# Patient Record
Sex: Male | Born: 1955
Health system: Southern US, Community
[De-identification: ages and names within clinical notes are randomized; demographics above are authoritative.]

## PROBLEM LIST (undated history)

## (undated) DIAGNOSIS — R739 Hyperglycemia, unspecified: Secondary | ICD-10-CM

## (undated) DIAGNOSIS — B1710 Acute hepatitis C without hepatic coma: Secondary | ICD-10-CM

## (undated) DIAGNOSIS — R5382 Chronic fatigue, unspecified: Secondary | ICD-10-CM

## (undated) DIAGNOSIS — K7460 Unspecified cirrhosis of liver: Secondary | ICD-10-CM

## (undated) DIAGNOSIS — R040 Epistaxis: Secondary | ICD-10-CM

## (undated) HISTORY — DX: Unspecified cirrhosis of liver: K74.60

## (undated) HISTORY — PX: HERNIA REPAIR: SHX51

## (undated) HISTORY — DX: Epistaxis: R04.0

## (undated) HISTORY — DX: Hyperglycemia, unspecified: R73.9

## (undated) HISTORY — DX: Acute hepatitis C without hepatic coma: B17.10

## (undated) HISTORY — DX: Chronic fatigue, unspecified: R53.82

---

## 2008-08-26 ENCOUNTER — Ambulatory Visit: Payer: Self-pay | Admitting: Family Medicine

## 2010-12-26 DIAGNOSIS — B182 Chronic viral hepatitis C: Secondary | ICD-10-CM | POA: Insufficient documentation

## 2011-10-18 ENCOUNTER — Ambulatory Visit: Payer: Self-pay | Admitting: Surgery

## 2011-10-24 ENCOUNTER — Ambulatory Visit: Payer: Self-pay | Admitting: Surgery

## 2011-11-01 ENCOUNTER — Ambulatory Visit: Payer: Self-pay | Admitting: Oncology

## 2011-11-24 ENCOUNTER — Ambulatory Visit: Payer: Self-pay | Admitting: Gastroenterology

## 2011-11-26 ENCOUNTER — Ambulatory Visit: Payer: Self-pay | Admitting: Oncology

## 2011-11-29 ENCOUNTER — Ambulatory Visit: Payer: Self-pay | Admitting: Unknown Physician Specialty

## 2011-11-29 ENCOUNTER — Ambulatory Visit: Payer: Self-pay | Admitting: Gastroenterology

## 2011-12-01 ENCOUNTER — Ambulatory Visit: Payer: Self-pay | Admitting: Surgery

## 2012-03-18 IMAGING — CT CT ABDOMEN W/ CM
1 of 2 series · 15 of 32 positions shown, 20 images · non-contrast
Comparison: none

REASON FOR EXAM: hepatitis
COMMENTS:

PROCEDURE:     CT  - CT ABDOMEN STANDARD W  - November 29, 2011  [DATE]
RESULT:
HISTORY: Hepatitis.
Comparison Study: No recent.

[Series 2: soft tissue · axial · 0.78mm/px · z∈[-828,-573]mm · 15 of 96 slices shown, 20 images]
[im 6/96  soft-tissue]
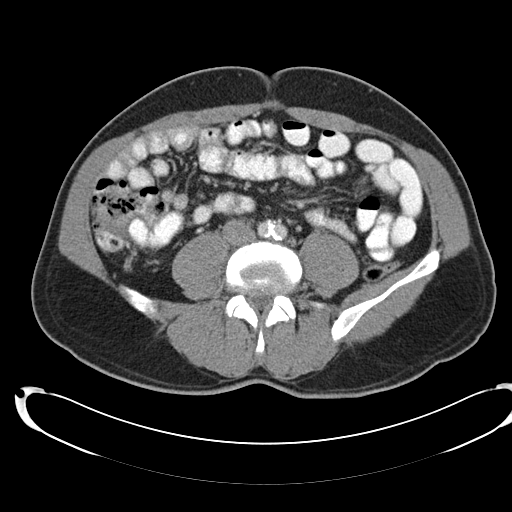
[im 6/96  bone]
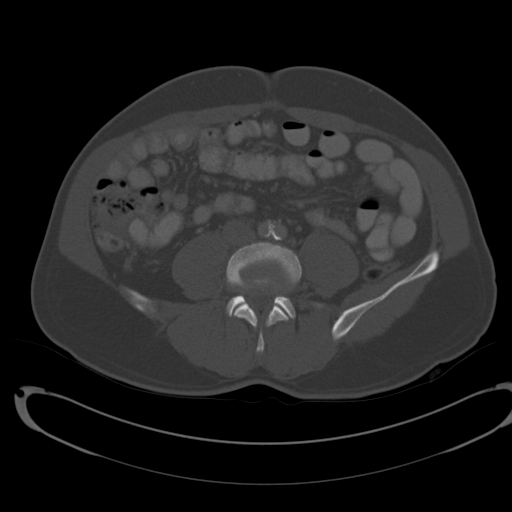
[im 11/96  soft-tissue]
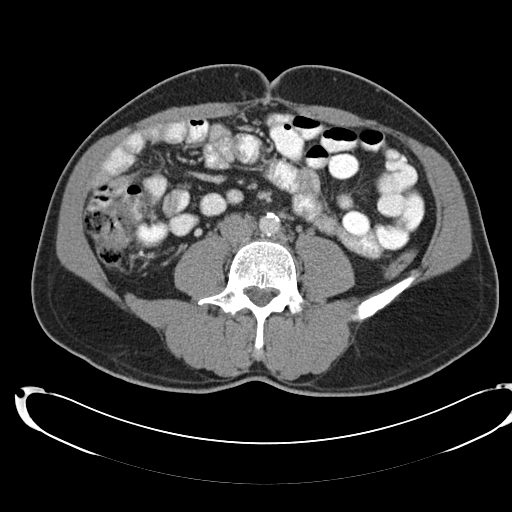
[im 21/96  soft-tissue]
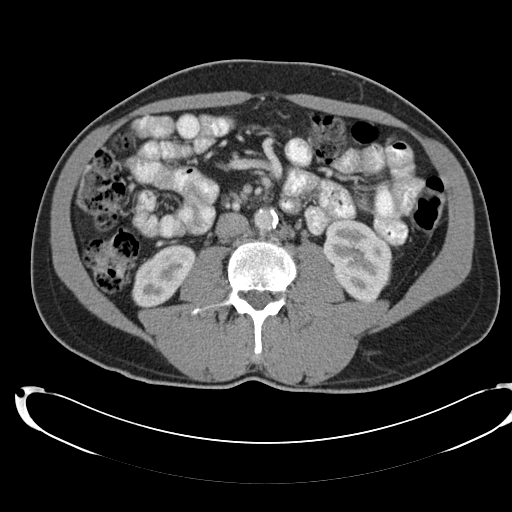
[im 26/96  soft-tissue]
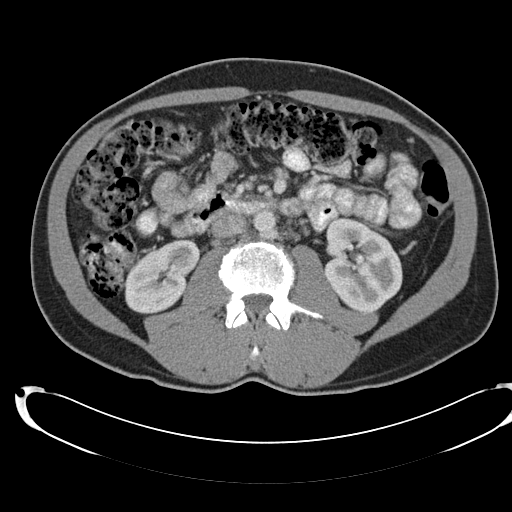
[im 31/96  soft-tissue]
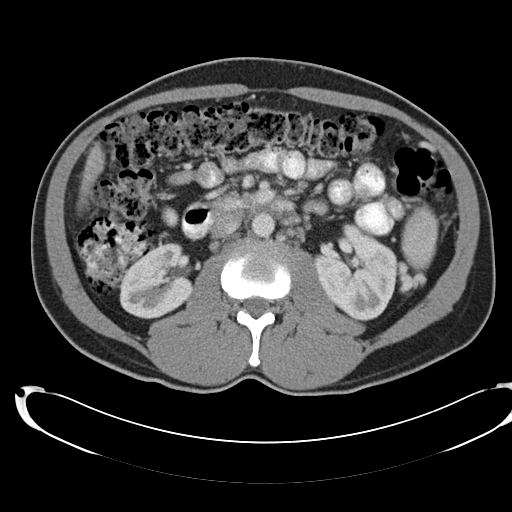
[im 41/96  soft-tissue]
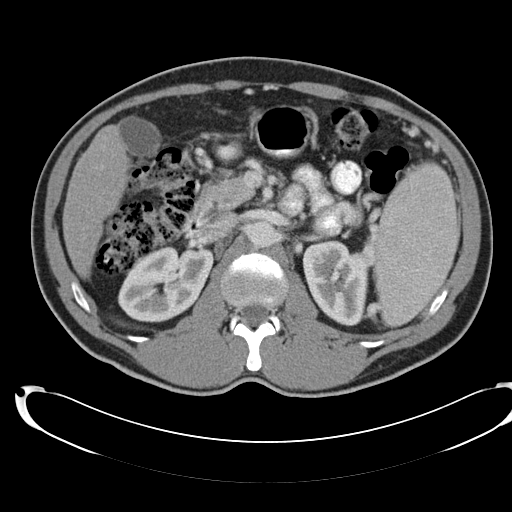
[im 46/96  soft-tissue]
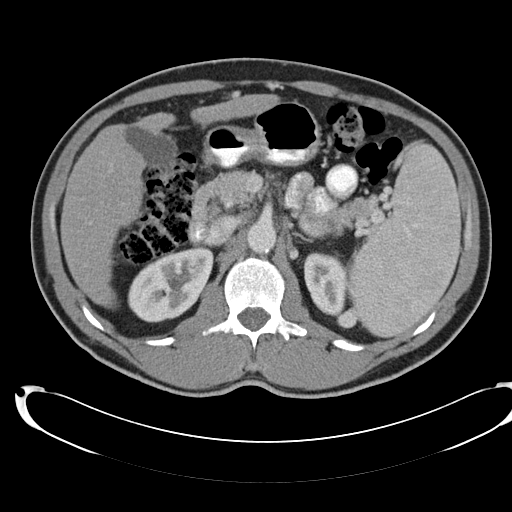
[im 51/96  soft-tissue]
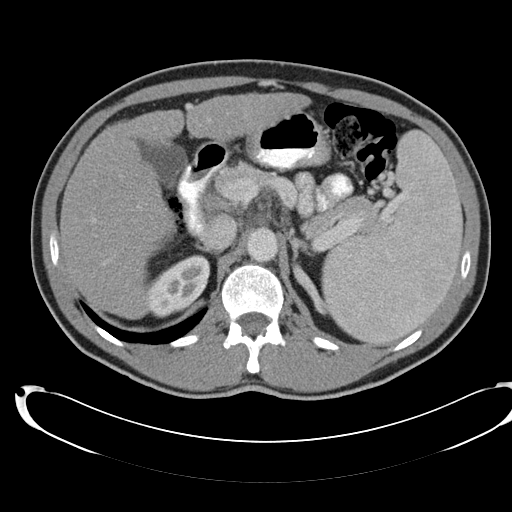
[im 56/96  soft-tissue]
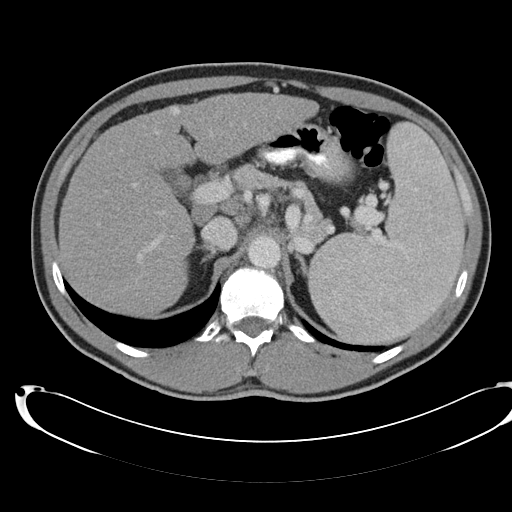
[im 56/96  bone]
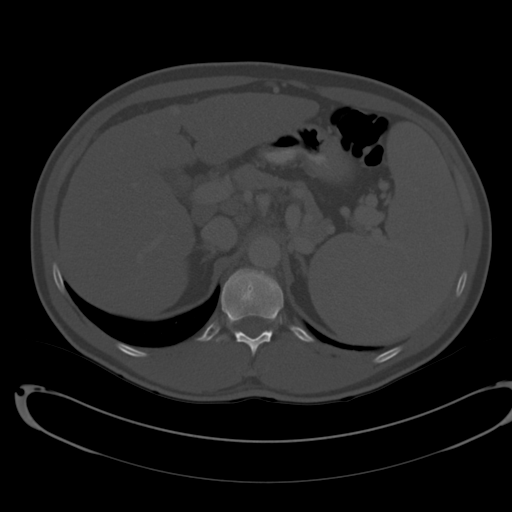
[im 66/96  soft-tissue]
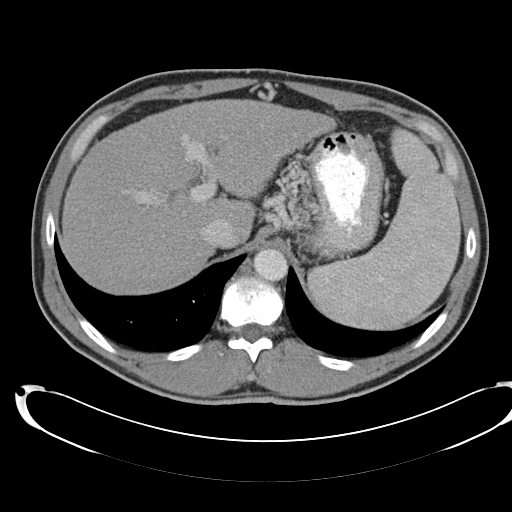
[im 71/96  soft-tissue]
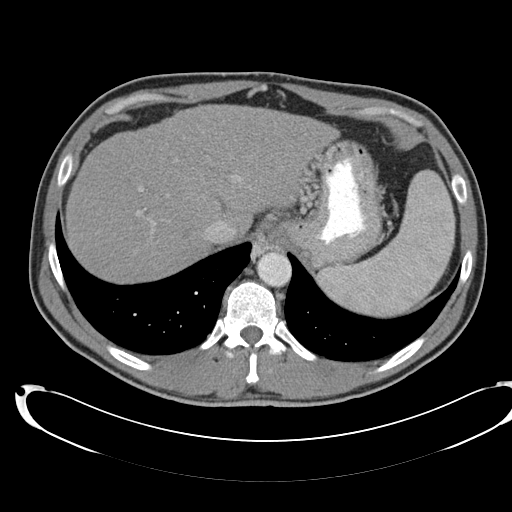
[im 76/96  soft-tissue]
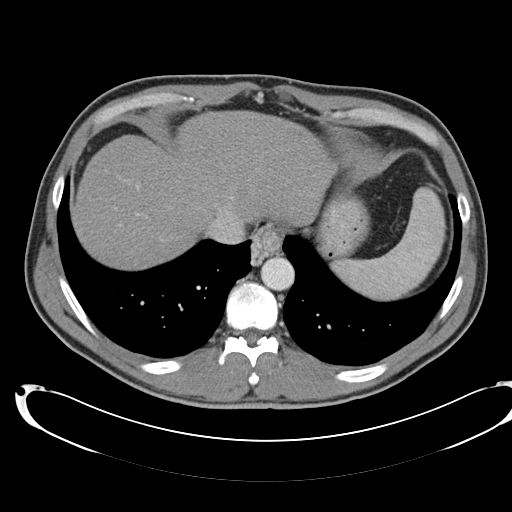
[im 76/96  lung]
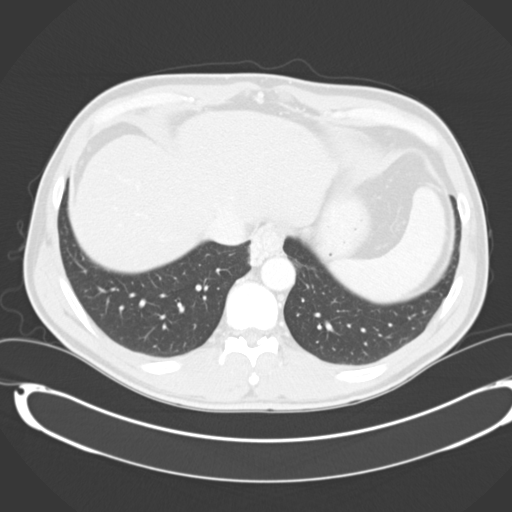
[im 81/96  lung]
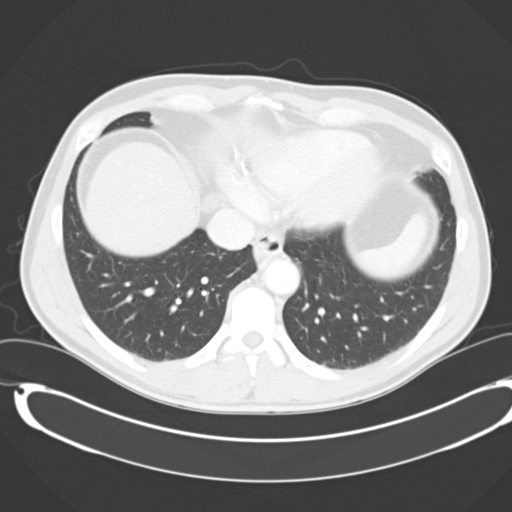
[im 86/96  soft-tissue]
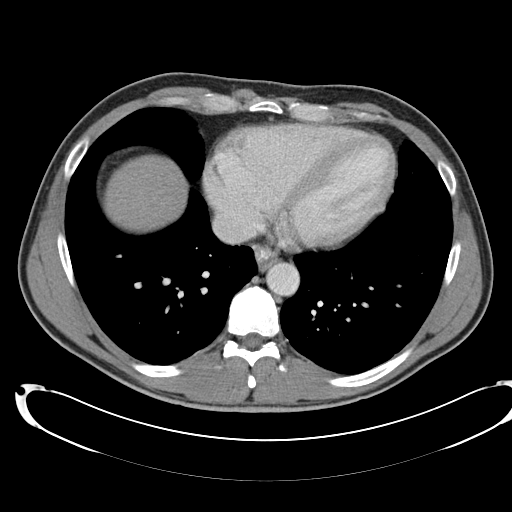
[im 86/96  lung]
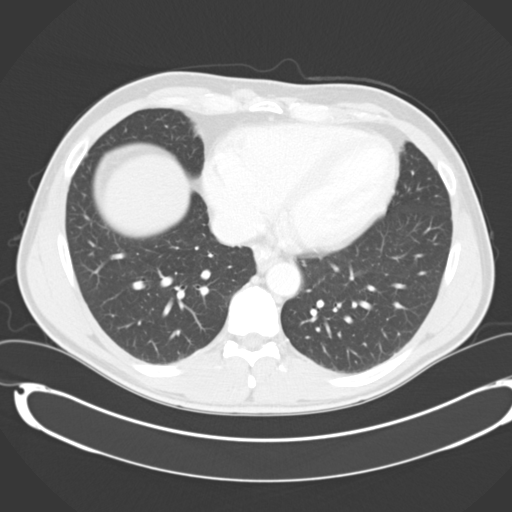
[im 91/96  soft-tissue]
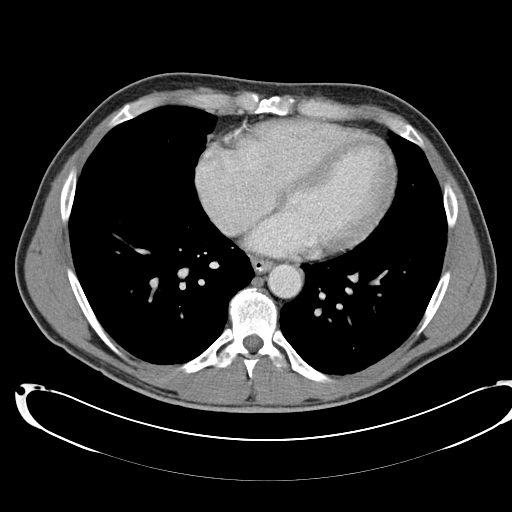
[im 91/96  lung]
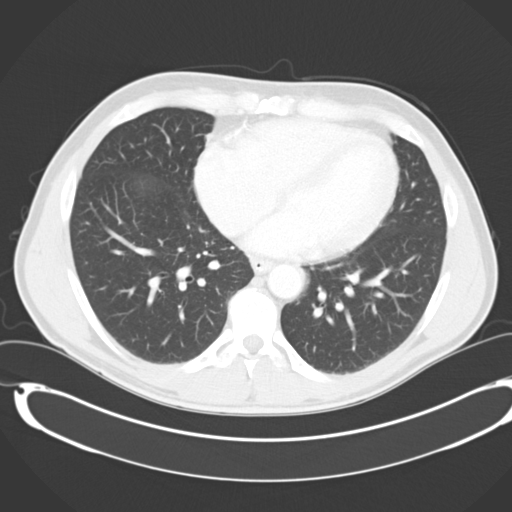

[15 of 32 positions shown; findings below may reference images not displayed]

FINDINGS: Standard CT obtained with 100 ml of [id number]-[id number]. The liver is
normal. Gallbladder is nondistended. The spleen is enlarged. The pancreas is
normal. Prominent varices are noted in the perigastric and perisplenic
region. These findings are consistent with portal hypertension. The portal
vein is patent. The adrenals are normal. The kidneys are normal.
Retroperitoneal nodularity is noted. These may represent retroperitoneal
varices. Small retroperitoneal lymph nodes cannot be excluded. Aorta is
nondistended. Lung bases are clear. No free air. No evidence of bowel
obstruction.
IMPRESSION: Findings consistent with portal hypertension and
splenomegaly. Prominent varices are noted.

## 2015-02-09 LAB — HM COLONOSCOPY

## 2015-08-27 ENCOUNTER — Encounter: Payer: Self-pay | Admitting: Family Medicine

## 2015-08-28 ENCOUNTER — Ambulatory Visit (INDEPENDENT_AMBULATORY_CARE_PROVIDER_SITE_OTHER): Payer: BLUE CROSS/BLUE SHIELD | Admitting: Family Medicine

## 2015-08-28 ENCOUNTER — Encounter: Payer: Self-pay | Admitting: Family Medicine

## 2015-08-28 ENCOUNTER — Telehealth: Payer: Self-pay | Admitting: Family Medicine

## 2015-08-28 VITALS — BP 120/60 | HR 56 | Temp 98.4°F | Resp 16 | Ht 70.0 in | Wt 194.0 lb

## 2015-08-28 DIAGNOSIS — R7309 Other abnormal glucose: Secondary | ICD-10-CM | POA: Diagnosis not present

## 2015-08-28 DIAGNOSIS — K766 Portal hypertension: Secondary | ICD-10-CM

## 2015-08-28 DIAGNOSIS — B182 Chronic viral hepatitis C: Secondary | ICD-10-CM | POA: Insufficient documentation

## 2015-08-28 DIAGNOSIS — I8510 Secondary esophageal varices without bleeding: Secondary | ICD-10-CM

## 2015-08-28 DIAGNOSIS — K7460 Unspecified cirrhosis of liver: Secondary | ICD-10-CM

## 2015-08-28 DIAGNOSIS — R7303 Prediabetes: Secondary | ICD-10-CM | POA: Insufficient documentation

## 2015-08-28 DIAGNOSIS — B1710 Acute hepatitis C without hepatic coma: Secondary | ICD-10-CM | POA: Insufficient documentation

## 2015-08-28 LAB — POCT GLYCOSYLATED HEMOGLOBIN (HGB A1C)

## 2015-08-28 NOTE — Progress Notes (Signed)
Name: Jesus Mejia   MRN: [id number]    DOB: 12/27/1955   Date:08/28/2015       Progress Note  Subjective  Chief Complaint  Chief Complaint  Patient presents with  . Diabetes    HPI Here for f/u of Portal hypertenskion due to chronic liver disease and pre-dioaetes.  Has changed diet and lifestyle.  Feels well.  Sees [facility] re: his liver disease.  No problem-specific assessment & plan notes found for this encounter.   Past Medical History  Diagnosis Date  . Acute hepatitis C   . Cirrhosis, non-alcoholic   . Bleeding nose   . Chronic fatigue   . Elevated serum glucose     Social History  Substance Use Topics  . Smoking status: Former Smoker -- 1.00 packs/day for 20 years    Types: Cigarettes    Quit date: 09/08/1993  . Smokeless tobacco: Never Used  . Alcohol Use: No     Comment: former quit 14 years ago     Current outpatient prescriptions:  .  calcium & magnesium carbonates (MYLANTA) 311-232 MG per tablet, Take 1 tablet by mouth daily., Disp: , Rfl:  .  Methylsulfonylmethane 1000 MG TABS, Take 1 tablet by mouth daily., Disp: , Rfl:  .  Multiple Vitamin (MULTIVITAMIN WITH MINERALS) TABS tablet, Take 1 tablet by mouth daily., Disp: , Rfl:  .  nadolol (CORGARD) 20 MG tablet, Take 20 mg by mouth daily., Disp: , Rfl:   No Known Allergies  Review of Systems  Constitutional: Positive for weight loss (desired). Negative for fever, chills and malaise/fatigue.  HENT: Negative for hearing loss.   Eyes: Negative for blurred vision and double vision.  Respiratory: Negative for sputum production, shortness of breath and wheezing.   Cardiovascular: Negative for chest pain, palpitations, orthopnea, claudication and leg swelling.  Gastrointestinal: Negative for heartburn, nausea, vomiting, abdominal pain, diarrhea and blood in stool.  Genitourinary: Negative for dysuria, urgency and frequency.  Musculoskeletal: Negative for myalgias and joint pain.  Skin: Negative for rash.   Neurological: Negative for dizziness, weakness and headaches.      Objective  Filed Vitals:   08/28/15 1342 08/28/15 1418  BP: 128/74 120/60  Pulse: 52 56  Temp: 98.4 F (36.9 C)   TempSrc: Oral   Resp: 16   Height:  (1.778 m)   Weight: 194 lb (87.998 kg)      Physical Exam  Constitutional: He is oriented to person, place, and time and well-developed, well-nourished, and in no distress. No distress.  HENT:  Head: Normocephalic and atraumatic.  Neck: Normal range of motion. Neck supple. Carotid bruit is not present. No thyromegaly present.  Cardiovascular: Normal rate, regular rhythm and intact distal pulses.  Exam reveals no gallop and no friction rub.   Murmur heard.  Systolic murmur is present with a grade of 1/6  URSB  Pulmonary/Chest: Effort normal and breath sounds normal. No respiratory distress. He has no wheezes. He has no rales.  Abdominal: Soft. Bowel sounds are normal. He exhibits no distension, no abdominal bruit and no mass. There is no tenderness.  Musculoskeletal: He exhibits no edema.  Lymphadenopathy:    He has no cervical adenopathy.  Neurological: He is alert and oriented to person, place, and time.  Vitals reviewed.     Recent Results (from the past 2160 hour(s))  POCT HgB A1C     Status: Normal   Collection Time: 08/28/15  2:05 PM  Result Value Ref Range   Hemoglobin  A1C 5.5 %      Assessment & Plan  1. Prediabetes  - POCT HgB A1C--5.5. 2. Hypertension, portal   3. Esophageal varices in cirrhosis

## 2015-08-28 NOTE — Patient Instructions (Signed)
Continue to follow good diet and exercise pattern.   Cont. To follow [facility]. To get Flu shot at wife"s work free next month.

## 2015-10-07 NOTE — Telephone Encounter (Signed)
Error

## 2015-10-26 ENCOUNTER — Ambulatory Visit (INDEPENDENT_AMBULATORY_CARE_PROVIDER_SITE_OTHER): Payer: BLUE CROSS/BLUE SHIELD

## 2015-10-26 DIAGNOSIS — Z23 Encounter for immunization: Secondary | ICD-10-CM | POA: Diagnosis not present

## 2015-12-11 ENCOUNTER — Encounter: Payer: Self-pay | Admitting: Family Medicine

## 2015-12-11 ENCOUNTER — Ambulatory Visit (INDEPENDENT_AMBULATORY_CARE_PROVIDER_SITE_OTHER): Payer: BLUE CROSS/BLUE SHIELD | Admitting: Family Medicine

## 2015-12-11 VITALS — BP 162/95 | HR 60 | Temp 98.0°F | Resp 16 | Ht 70.0 in | Wt 194.0 lb

## 2015-12-11 DIAGNOSIS — R7309 Other abnormal glucose: Secondary | ICD-10-CM | POA: Diagnosis not present

## 2015-12-11 DIAGNOSIS — I8510 Secondary esophageal varices without bleeding: Secondary | ICD-10-CM

## 2015-12-11 DIAGNOSIS — K766 Portal hypertension: Secondary | ICD-10-CM

## 2015-12-11 DIAGNOSIS — R7303 Prediabetes: Secondary | ICD-10-CM

## 2015-12-11 DIAGNOSIS — K7460 Unspecified cirrhosis of liver: Secondary | ICD-10-CM

## 2015-12-11 LAB — POCT GLYCOSYLATED HEMOGLOBIN (HGB A1C)

## 2015-12-11 MED ORDER — NADOLOL 20 MG PO TABS: ORAL_TABLET | ORAL | Status: DC

## 2015-12-11 MED ORDER — LOSARTAN POTASSIUM 25 MG PO TABS: 25.0000 mg | ORAL_TABLET | Freq: Every day | ORAL | Status: DC

## 2015-12-11 NOTE — Progress Notes (Signed)
Name: Jesus Mejia   MRN: [id number]    DOB: 1956/01/05   Date:12/11/2015       Progress Note  Subjective  Chief Complaint  Chief Complaint  Patient presents with  . Hyperglycemia    HPI Here for f/u of pre-diabetes.  Has chronic liver disease and some esophageal varices.  HE is on Beta blocker to reduce pressure in Liver.  No problem-specific assessment & plan notes found for this encounter.   Past Medical History  Diagnosis Date  . Acute hepatitis C   . Cirrhosis, non-alcoholic (HCC)   . Bleeding nose   . Chronic fatigue   . Elevated serum glucose     Past Surgical History  Procedure Laterality Date  . Hernia repair      Family History  Problem Relation Age of Onset  . Cancer Mother     breast  . Cancer Sister     breast  . Hypertension Brother     Social History   Social History  . Marital Status: Married    Spouse Name: N/A  . Number of Children: N/A  . Years of Education: N/A   Occupational History  . Not on file.   Social History Main Topics  . Smoking status: Former Smoker -- 1.00 packs/day for 20 years    Types: Cigarettes    Quit date: 09/08/1993  . Smokeless tobacco: Never Used  . Alcohol Use: No     Comment: former quit 14 years ago  . Drug Use: No     Comment: former quit 15 years ago  . Sexual Activity: Not on file   Other Topics Concern  . Not on file   Social History Narrative     Current outpatient prescriptions:  .  calcium & magnesium carbonates (MYLANTA) 311-232 MG per tablet, Take 1 tablet by mouth daily as needed. , Disp: , Rfl:  .  Methylsulfonylmethane 1000 MG TABS, Take 1 tablet by mouth daily., Disp: , Rfl:  .  Multiple Vitamin (MULTIVITAMIN WITH MINERALS) TABS tablet, Take 1 tablet by mouth daily., Disp: , Rfl:  .  nadolol (CORGARD) 20 MG tablet, Takes 1/2 tablet daily, Disp: 30 tablet, Rfl: 12 .  losartan (COZAAR) 25 MG tablet, Take 1 tablet (25 mg total) by mouth daily., Disp: 30 tablet, Rfl: 12  Not on  File   Review of Systems  Constitutional: Negative for fever, chills, weight loss and malaise/fatigue.  HENT: Negative for hearing loss.   Eyes: Negative for blurred vision, double vision and redness.  Respiratory: Negative for cough, shortness of breath and wheezing.   Cardiovascular: Negative for chest pain, palpitations and leg swelling.  Gastrointestinal: Negative for heartburn, nausea, vomiting, abdominal pain and blood in stool.  Genitourinary: Negative for dysuria, urgency and frequency.  Skin: Negative for rash.  Neurological: Negative for dizziness, tremors, weakness and headaches.  Psychiatric/Behavioral: Negative for depression.      Objective  Filed Vitals:   12/11/15 1334  BP: 162/95  Pulse: 60  Temp: 98 F (36.7 C)  TempSrc: Oral  Resp: 16  Height: 5\' 10"  (1.778 m)  Weight: 194 lb (87.998 kg)    Physical Exam  Constitutional: He is oriented to person, place, and time and well-developed, well-nourished, and in no distress. No distress.  HENT:  Head: Normocephalic and atraumatic.  Eyes: Conjunctivae are normal. Pupils are equal, round, and reactive to light. No scleral icterus.  Neck: Neck supple. Carotid bruit is not present. No thyromegaly present.  Cardiovascular: Normal  rate, regular rhythm and normal heart sounds.  Exam reveals no gallop and no friction rub.   No murmur heard. Pulmonary/Chest: Effort normal and breath sounds normal. No respiratory distress. He has no wheezes. He has no rales.  Abdominal: Soft. Bowel sounds are normal. He exhibits no distension, no abdominal bruit and no mass. There is no tenderness.  Musculoskeletal: He exhibits edema.  Lymphadenopathy:    He has no cervical adenopathy.  Neurological: He is alert and oriented to person, place, and time.  Vitals reviewed.      No results found for this or any previous visit (from the past 2160 hour(s)).   Assessment & Plan  Problem List Items Addressed This Visit       Cardiovascular and Mediastinum   Esophageal varices in cirrhosis (HCC)   Relevant Medications   losartan (COZAAR) 25 MG tablet   nadolol (CORGARD) 20 MG tablet   Hypertension, portal (HCC)   Relevant Medications   losartan (COZAAR) 25 MG tablet   nadolol (CORGARD) 20 MG tablet     Other   Prediabetes   Elevated glucose - Primary   Relevant Orders   POCT HgB A1C      Meds ordered this encounter  Medications  . losartan (COZAAR) 25 MG tablet    Sig: Take 1 tablet (25 mg total) by mouth daily.    Dispense:  30 tablet    Refill:  12  . nadolol (CORGARD) 20 MG tablet    Sig: Takes 1/2 tablet daily    Dispense:  30 tablet    Refill:  12   1. Elevated glucose  - POCT HgB A1C-5.2  2. Hypertension, portal (HCC)  - losartan (COZAAR) 25 MG tablet; Take 1 tablet (25 mg total) by mouth daily.  Dispense: 30 tablet; Refill: 12 - nadolol (CORGARD) 20 MG tablet; Takes 1/2 tablet daily  Dispense: 30 tablet; Refill: 12  3. Esophageal varices in cirrhosis (HCC)   4. Prediabetes

## 2016-02-02 ENCOUNTER — Ambulatory Visit: Payer: BLUE CROSS/BLUE SHIELD | Admitting: Family Medicine

## 2016-03-07 ENCOUNTER — Other Ambulatory Visit: Payer: Self-pay | Admitting: Family Medicine

## 2016-03-07 DIAGNOSIS — K766 Portal hypertension: Secondary | ICD-10-CM

## 2016-03-07 MED ORDER — LOSARTAN POTASSIUM 25 MG PO TABS: 25.0000 mg | ORAL_TABLET | Freq: Every day | ORAL | Status: DC

## 2016-03-10 ENCOUNTER — Other Ambulatory Visit: Payer: Self-pay

## 2016-03-10 DIAGNOSIS — K766 Portal hypertension: Secondary | ICD-10-CM

## 2016-03-10 MED ORDER — LOSARTAN POTASSIUM 25 MG PO TABS: 25.0000 mg | ORAL_TABLET | Freq: Every day | ORAL | Status: DC

## 2016-03-28 ENCOUNTER — Encounter: Payer: Self-pay | Admitting: Family Medicine

## 2016-03-28 ENCOUNTER — Ambulatory Visit (INDEPENDENT_AMBULATORY_CARE_PROVIDER_SITE_OTHER): Payer: BLUE CROSS/BLUE SHIELD | Admitting: Family Medicine

## 2016-03-28 VITALS — BP 125/70 | HR 60 | Resp 16 | Ht 70.0 in | Wt 192.8 lb

## 2016-03-28 DIAGNOSIS — K766 Portal hypertension: Secondary | ICD-10-CM | POA: Diagnosis not present

## 2016-03-28 DIAGNOSIS — B182 Chronic viral hepatitis C: Secondary | ICD-10-CM | POA: Diagnosis not present

## 2016-03-28 DIAGNOSIS — B1920 Unspecified viral hepatitis C without hepatic coma: Secondary | ICD-10-CM

## 2016-03-28 NOTE — Progress Notes (Signed)
Name: Jesus Mejia   MRN: [id number]    DOB: Sep 01, 1956   Date:03/28/2016       Progress Note  Subjective  Chief Complaint  Chief Complaint  Patient presents with  . Hypertension    HPI Here for f/u of HBP and portal hypertension sec. to chronic liver disease.  Sees Liver Specialist at [facility]. Stays in touch with PA at [facility].  No problem-specific assessment & plan notes found for this encounter.   Past Medical History  Diagnosis Date  . Acute hepatitis C   . Cirrhosis, non-alcoholic (HCC)   . Bleeding nose   . Chronic fatigue   . Elevated serum glucose     Past Surgical History  Procedure Laterality Date  . Hernia repair      Family History  Problem Relation Age of Onset  . Cancer Mother     breast  . Cancer Sister     breast  . Hypertension Brother     Social History   Social History  . Marital Status: Married    Spouse Name: N/A  . Number of Children: N/A  . Years of Education: N/A   Occupational History  . Not on file.   Social History Main Topics  . Smoking status: Former Smoker -- 1.00 packs/day for 20 years    Types: Cigarettes    Quit date: 09/08/1993  . Smokeless tobacco: Never Used  . Alcohol Use: No     Comment: former quit 14 years ago  . Drug Use: No     Comment: former quit 15 years ago  . Sexual Activity: Not on file   Other Topics Concern  . Not on file   Social History Narrative     Current outpatient prescriptions:  .  calcium & magnesium carbonates (MYLANTA) 311-232 MG per tablet, Take 1 tablet by mouth daily as needed. , Disp: , Rfl:  .  losartan (COZAAR) 25 MG tablet, Take 1 tablet (25 mg total) by mouth daily., Disp: 90 tablet, Rfl: 0 .  Magnesium 250 MG TABS, Take by mouth., Disp: , Rfl:  .  Methylsulfonylmethane 1000 MG TABS, Take 1 tablet by mouth daily., Disp: , Rfl:  .  Multiple Vitamin (MULTIVITAMIN WITH MINERALS) TABS tablet, Take 1 tablet by mouth daily., Disp: , Rfl:  .  nadolol (CORGARD) 20 MG tablet, Takes  1/2 tablet daily, Disp: 30 tablet, Rfl: 12 .  omeprazole (PRILOSEC) 20 MG capsule, Take by mouth. Reported on 03/28/2016, Disp: , Rfl:   Not on File   Review of Systems  Constitutional: Negative for fever, chills, weight loss and malaise/fatigue.  HENT: Negative for hearing loss.   Eyes: Negative for blurred vision and double vision.  Respiratory: Negative for cough, shortness of breath and wheezing.   Cardiovascular: Negative for chest pain, palpitations and leg swelling.  Gastrointestinal: Negative for heartburn, abdominal pain, blood in stool and melena.  Genitourinary: Negative for dysuria, urgency and frequency.  Musculoskeletal: Negative for myalgias and joint pain.  Skin: Negative for rash.  Neurological: Negative for tremors, weakness and headaches.      Objective  Filed Vitals:   03/28/16 1140 03/28/16 1200  BP: 124/68 125/70  Pulse: 49 60  Resp: 16   Height:  (1.778 m)   Weight: 192 lb 12.8 oz (87.454 kg)     Physical Exam  Constitutional: He is oriented to person, place, and time and well-developed, well-nourished, and in no distress. No distress.  HENT:  Head: Normocephalic and atraumatic.  Eyes: Conjunctivae and EOM are normal. Pupils are equal, round, and reactive to light. No scleral icterus.  Neck: Normal range of motion. Neck supple. Carotid bruit is not present. No thyromegaly present.  Cardiovascular: Normal rate, regular rhythm and normal heart sounds.  Exam reveals no gallop and no friction rub.   No murmur heard. Pulmonary/Chest: Effort normal and breath sounds normal. No respiratory distress. He has no wheezes. He has no rales.  Abdominal: Soft. Bowel sounds are normal. He exhibits no distension, no abdominal bruit and no mass. There is no tenderness.  Musculoskeletal: He exhibits no edema.  Lymphadenopathy:    He has no cervical adenopathy.  Neurological: He is alert and oriented to person, place, and time.  Vitals reviewed.      No  results found for this or any previous visit (from the past 2160 hour(s)).   Assessment & Plan  Problem List Items Addressed This Visit      Cardiovascular and Mediastinum   Hypertension, portal (HCC) - Primary     Digestive   Chronic viral hepatitis C (HCC)   HCV (hepatitis C virus)      Meds ordered this encounter  Medications  . Magnesium 250 MG TABS    Sig: Take by mouth.  Marland Kitchen. omeprazole (PRILOSEC) 20 MG capsule    Sig: Take by mouth. Reported on 03/28/2016   1. Hypertension, portal (HCC) Cont Nadolol and Losartan  2. Hepatitis C virus infection without hepatic coma, unspecified chronicity [facility]  3. Chronic hepatitis C without hepatic coma (HCC)

## 2016-06-11 ENCOUNTER — Other Ambulatory Visit: Payer: Self-pay | Admitting: Family Medicine

## 2016-08-23 DIAGNOSIS — K7460 Unspecified cirrhosis of liver: Secondary | ICD-10-CM | POA: Diagnosis not present

## 2016-08-23 DIAGNOSIS — K766 Portal hypertension: Secondary | ICD-10-CM | POA: Diagnosis not present

## 2016-08-23 DIAGNOSIS — B182 Chronic viral hepatitis C: Secondary | ICD-10-CM | POA: Diagnosis not present

## 2016-08-23 DIAGNOSIS — D696 Thrombocytopenia, unspecified: Secondary | ICD-10-CM | POA: Diagnosis not present

## 2016-08-23 DIAGNOSIS — Z87891 Personal history of nicotine dependence: Secondary | ICD-10-CM | POA: Diagnosis not present

## 2016-08-23 DIAGNOSIS — I8500 Esophageal varices without bleeding: Secondary | ICD-10-CM | POA: Diagnosis not present

## 2016-09-29 ENCOUNTER — Ambulatory Visit (INDEPENDENT_AMBULATORY_CARE_PROVIDER_SITE_OTHER): Payer: BLUE CROSS/BLUE SHIELD | Admitting: Family Medicine

## 2016-09-29 ENCOUNTER — Encounter: Payer: Self-pay | Admitting: Family Medicine

## 2016-09-29 VITALS — BP 120/60 | HR 55 | Temp 98.6°F | Resp 16 | Ht 70.0 in | Wt 190.0 lb

## 2016-09-29 DIAGNOSIS — J40 Bronchitis, not specified as acute or chronic: Secondary | ICD-10-CM | POA: Diagnosis not present

## 2016-09-29 DIAGNOSIS — K766 Portal hypertension: Secondary | ICD-10-CM | POA: Diagnosis not present

## 2016-09-29 DIAGNOSIS — B182 Chronic viral hepatitis C: Secondary | ICD-10-CM | POA: Diagnosis not present

## 2016-09-29 DIAGNOSIS — K7460 Unspecified cirrhosis of liver: Secondary | ICD-10-CM | POA: Diagnosis not present

## 2016-09-29 DIAGNOSIS — I8510 Secondary esophageal varices without bleeding: Secondary | ICD-10-CM

## 2016-09-29 MED ORDER — AZITHROMYCIN 250 MG PO TABS: ORAL_TABLET | ORAL | 0 refills | Status: DC

## 2016-09-29 MED ORDER — HYDROCOD POLST-CPM POLST ER 10-8 MG/5ML PO SUER: 2.5000 mL | Freq: Two times a day (BID) | ORAL | 0 refills | Status: DC | PRN

## 2016-09-29 NOTE — Patient Instructions (Signed)
   To get flu shot when bronchitis resolves.

## 2016-09-29 NOTE — Progress Notes (Signed)
Name: Jesus Mejia   MRN: [id number]    DOB: 07-18-1956   Date:09/29/2016       Progress Note  Subjective  Chief Complaint  Chief Complaint  Patient presents with  . Hypertension    HPI Here for f/u of HBP and chronic liver disease.  He has had some extrasystoles that are controlled by Nadolol.    He has nasal congestion, some sore throat.  Now has develpoed a cough that is productive of brown-yellow sputum.  Going on for 5-6 days and worsening for 3 days. No problem-specific Assessment & Plan notes found for this encounter.   Past Medical History:  Diagnosis Date  . Acute hepatitis C   . Bleeding nose   . Chronic fatigue   . Cirrhosis, non-alcoholic (HCC)   . Elevated serum glucose     Past Surgical History:  Procedure Laterality Date  . HERNIA REPAIR      Family History  Problem Relation Age of Onset  . Cancer Mother     breast  . Cancer Sister     breast  . Hypertension Brother     Social History   Social History  . Marital status: Married    Spouse name: N/A  . Number of children: N/A  . Years of education: N/A   Occupational History  . Not on file.   Social History Main Topics  . Smoking status: Former Smoker    Packs/day: 1.00    Years: 20.00    Types: Cigarettes    Quit date: 09/08/1993  . Smokeless tobacco: Never Used  . Alcohol use No     Comment: former quit 14 years ago  . Drug use: No     Comment: former quit 15 years ago  . Sexual activity: Not on file   Other Topics Concern  . Not on file   Social History Narrative  . No narrative on file     Current Outpatient Prescriptions:  .  calcium & magnesium carbonates (MYLANTA) 311-232 MG per tablet, Take 1 tablet by mouth daily as needed. , Disp: , Rfl:  .  losartan (COZAAR) 25 MG tablet, TAKE 1 TABLET (25 MG TOTAL) BY MOUTH DAILY., Disp: 90 tablet, Rfl: 3 .  Magnesium 250 MG TABS, Take by mouth., Disp: , Rfl:  .  Methylsulfonylmethane 1000 MG CAPS, Take 1 capsule by mouth  daily as needed., Disp: , Rfl:  .  Multiple Vitamin (MULTIVITAMIN WITH MINERALS) TABS tablet, Take 1 tablet by mouth daily., Disp: , Rfl:  .  nadolol (CORGARD) 20 MG tablet, Takes 1/2 tablet daily, Disp: 30 tablet, Rfl: 12 .  Methylsulfonylmethane 1000 MG TABS, Take 1 tablet by mouth daily., Disp: , Rfl:   Not on File   Review of Systems  Constitutional: Positive for malaise/fatigue. Negative for chills, fever and weight loss.  HENT: Positive for congestion and sore throat. Negative for hearing loss.   Eyes: Negative for blurred vision and double vision.  Respiratory: Positive for cough, sputum production and wheezing. Negative for shortness of breath.   Cardiovascular: Negative for chest pain, palpitations and leg swelling.  Gastrointestinal: Negative for abdominal pain, blood in stool and heartburn.  Genitourinary: Negative for dysuria, frequency and urgency.  Musculoskeletal: Negative for back pain and myalgias.  Skin: Negative for rash.  Neurological: Negative for dizziness, tremors, weakness and headaches.      Objective  Vitals:   09/29/16 0826  BP: 134/75  Pulse: (!) 55  Resp: 16  Temp: 98.6  F (37 C)  TempSrc: Oral  Weight: 190 lb (86.2 kg)  Height: 5\' 10"  (1.778 m)    Physical Exam  Constitutional: He is oriented to person, place, and time and well-developed, well-nourished, and in no distress. No distress.  HENT:  Head: Normocephalic and atraumatic.  Right Ear: External ear normal.  Left Ear: External ear normal.  Nose: Rhinorrhea present. Right sinus exhibits no maxillary sinus tenderness and no frontal sinus tenderness. Left sinus exhibits no maxillary sinus tenderness and no frontal sinus tenderness.  Mouth/Throat: Oropharynx is clear and moist.  Eyes: Conjunctivae and EOM are normal. Pupils are equal, round, and reactive to light. No scleral icterus.  Neck: Normal range of motion. Neck supple. Carotid bruit is not present. No thyromegaly present.   Cardiovascular: Regular rhythm and normal heart sounds.  Bradycardia present.  Exam reveals no gallop and no friction rub.   No murmur heard. Pulmonary/Chest: Effort normal and breath sounds normal. No respiratory distress. He has no wheezes. He has no rales.  Coarse breath sounds throughout  Abdominal: Soft. Bowel sounds are normal. He exhibits no distension, no abdominal bruit and no mass. There is no tenderness.  Liver edge firm  Musculoskeletal: He exhibits no edema.  Lymphadenopathy:    He has no cervical adenopathy.  Neurological: He is alert and oriented to person, place, and time.  Vitals reviewed.      No results found for this or any previous visit (from the past 2160 hour(s)).   Assessment & Plan  Problem List Items Addressed This Visit    None    Visit Diagnoses   None.     Meds ordered this encounter  Medications  . Methylsulfonylmethane 1000 MG CAPS    Sig: Take 1 capsule by mouth daily as needed.   1. Hypertension, portal (HCC) Cont Losartan and Nadolol  2. Esophageal varices in cirrhosis (HCC)   3. Chronic hepatitis C without hepatic coma (HCC)   4. Bronchitis  - azithromycin (ZITHROMAX) 250 MG tablet; Take 2 tabs on day 1, then 1 tab daily on days 2-5.  Dispense: 6 tablet; Refill: 0 - chlorpheniramine-HYDROcodone (TUSSIONEX PENNKINETIC ER) 10-8 MG/5ML SUER; Take 2.5-5 mLs by mouth every 12 (twelve) hours as needed for cough.  Dispense: 115 mL; Refill: 0

## 2016-11-21 DIAGNOSIS — K7460 Unspecified cirrhosis of liver: Secondary | ICD-10-CM | POA: Diagnosis not present

## 2016-11-21 DIAGNOSIS — I8500 Esophageal varices without bleeding: Secondary | ICD-10-CM | POA: Diagnosis not present

## 2016-11-21 DIAGNOSIS — K3189 Other diseases of stomach and duodenum: Secondary | ICD-10-CM | POA: Diagnosis not present

## 2016-11-21 DIAGNOSIS — K766 Portal hypertension: Secondary | ICD-10-CM | POA: Diagnosis not present

## 2016-11-21 DIAGNOSIS — B1920 Unspecified viral hepatitis C without hepatic coma: Secondary | ICD-10-CM | POA: Diagnosis not present

## 2016-12-13 ENCOUNTER — Other Ambulatory Visit: Payer: Self-pay | Admitting: Family Medicine

## 2016-12-13 DIAGNOSIS — K766 Portal hypertension: Secondary | ICD-10-CM

## 2017-02-21 DIAGNOSIS — Z87891 Personal history of nicotine dependence: Secondary | ICD-10-CM | POA: Diagnosis not present

## 2017-02-21 DIAGNOSIS — K7460 Unspecified cirrhosis of liver: Secondary | ICD-10-CM | POA: Diagnosis not present

## 2017-02-21 DIAGNOSIS — B182 Chronic viral hepatitis C: Secondary | ICD-10-CM | POA: Diagnosis not present

## 2017-02-21 DIAGNOSIS — K766 Portal hypertension: Secondary | ICD-10-CM | POA: Diagnosis not present

## 2017-02-21 DIAGNOSIS — D696 Thrombocytopenia, unspecified: Secondary | ICD-10-CM | POA: Diagnosis not present

## 2017-02-21 DIAGNOSIS — I8500 Esophageal varices without bleeding: Secondary | ICD-10-CM | POA: Diagnosis not present

## 2017-02-21 DIAGNOSIS — R161 Splenomegaly, not elsewhere classified: Secondary | ICD-10-CM | POA: Diagnosis not present

## 2017-02-23 ENCOUNTER — Encounter: Payer: Self-pay | Admitting: Family Medicine

## 2017-02-23 ENCOUNTER — Ambulatory Visit (INDEPENDENT_AMBULATORY_CARE_PROVIDER_SITE_OTHER): Payer: BLUE CROSS/BLUE SHIELD | Admitting: Family Medicine

## 2017-02-23 VITALS — BP 135/70 | HR 56 | Temp 97.7°F | Resp 16 | Ht 70.0 in | Wt 188.0 lb

## 2017-02-23 DIAGNOSIS — B182 Chronic viral hepatitis C: Secondary | ICD-10-CM

## 2017-02-23 DIAGNOSIS — R7309 Other abnormal glucose: Secondary | ICD-10-CM

## 2017-02-23 DIAGNOSIS — K766 Portal hypertension: Secondary | ICD-10-CM | POA: Diagnosis not present

## 2017-02-23 LAB — HEMOGLOBIN A1C
HEMOGLOBIN A1C: 4.8 % (ref <5.7)
MEAN PLASMA GLUCOSE: 91 mg/dL

## 2017-02-23 MED ORDER — NADOLOL 20 MG PO TABS: ORAL_TABLET | ORAL | 3 refills | Status: DC

## 2017-02-23 MED ORDER — LOSARTAN POTASSIUM 50 MG PO TABS: 50.0000 mg | ORAL_TABLET | Freq: Every day | ORAL | 3 refills | Status: DC

## 2017-02-23 NOTE — Progress Notes (Signed)
Name: Jesus Mejia   MRN: [id number]    DOB: [birth year]   Date:02/23/2017       Progress Note  Subjective  Chief Complaint  Chief Complaint  Patient presents with  . Hypertension  . Hyperglycemia  . Hepatitis C    HPI Here for f/u of HBP, elevated blood sugar, chronic Hep C.  [facility] and recent visit was essentially ok.  Has portal hypertension but no problems at present.  Feeling well overall.  No problem-specific Assessment & Plan notes found for this encounter.   Past Medical History:  Diagnosis Date  . Acute hepatitis C   . Bleeding nose   . Chronic fatigue   . Cirrhosis, non-alcoholic (HCC)   . Elevated serum glucose     Past Surgical History:  Procedure Laterality Date  . HERNIA REPAIR      Family History  Problem Relation Age of Onset  . Cancer Mother     breast  . Cancer Sister     breast  . Hypertension Brother     Social History   Social History  . Marital status: Married    Spouse name: N/A  . Number of children: N/A  . Years of education: N/A   Occupational History  . Not on file.   Social History Main Topics  . Smoking status: Former Smoker    Packs/day: 1.00    Years: 20.00    Types: Cigarettes    Quit date: 09/08/1993  . Smokeless tobacco: Never Used  . Alcohol use No     Comment: former quit 14 years ago  . Drug use: No     Comment: former quit 15 years ago  . Sexual activity: Not on file   Other Topics Concern  . Not on file   Social History Narrative  . No narrative on file     Current Outpatient Prescriptions:  .  calcium & magnesium carbonates (MYLANTA) 311-232 MG per tablet, Take 1 tablet by mouth daily as needed. , Disp: , Rfl:  .  losartan (COZAAR) 50 MG tablet, Take 1 tablet (50 mg total) by mouth daily., Disp: 90 tablet, Rfl: 3 .  Magnesium 250 MG TABS, Take by mouth., Disp: , Rfl:  .  Methylsulfonylmethane 1000 MG CAPS, Take 1 capsule by mouth daily as needed., Disp: , Rfl:  .  Multiple Vitamin  (MULTIVITAMIN WITH MINERALS) TABS tablet, Take 1 tablet by mouth daily., Disp: , Rfl:  .  nadolol (CORGARD) 20 MG tablet, Take 1/2 tablet each PM, Disp: 45 tablet, Rfl: 3 .  Methylsulfonylmethane 1000 MG TABS, Take 1 tablet by mouth daily., Disp: , Rfl:   No Known Allergies   Review of Systems  Constitutional: Negative for chills, fever, malaise/fatigue and weight loss.  HENT: Negative for hearing loss and tinnitus.   Eyes: Negative for blurred vision and double vision.  Respiratory: Negative for cough, shortness of breath and wheezing.   Cardiovascular: Negative for chest pain, palpitations and orthopnea.  Gastrointestinal: Negative for abdominal pain, blood in stool and heartburn.  Genitourinary: Negative for dysuria, frequency and urgency.  Musculoskeletal: Negative for joint pain and myalgias.  Skin: Negative for rash.  Neurological: Negative for dizziness, tingling, tremors, weakness and headaches.      Objective  Vitals:   02/23/17 0900  BP: 135/70  Pulse: (!) 56  Resp: 16  Temp: 97.7 F (36.5 C)  TempSrc: Oral  Weight: 188 lb (85.3 kg)  Height: 5\' 10"  (1.778 m)  Physical Exam  Constitutional: He is oriented to person, place, and time and well-developed, well-nourished, and in no distress. No distress.  HENT:  Head: Normocephalic and atraumatic.  Eyes: Conjunctivae and EOM are normal. Pupils are equal, round, and reactive to light. No scleral icterus.  Neck: Normal range of motion. Neck supple. Carotid bruit is not present. No thyromegaly present.  Cardiovascular: Normal rate, regular rhythm and normal heart sounds.  Exam reveals no gallop and no friction rub.   No murmur heard. Pulmonary/Chest: Effort normal and breath sounds normal. No respiratory distress. He has no wheezes. He has no rales.  Abdominal: Soft. Bowel sounds are normal. He exhibits no distension and no mass. There is no tenderness.  Musculoskeletal: He exhibits no edema.  Lymphadenopathy:    He  has no cervical adenopathy.  Neurological: He is alert and oriented to person, place, and time.  Vitals reviewed.      No results found for this or any previous visit (from the past 2160 hour(s)).   Assessment & Plan  Problem List Items Addressed This Visit      Cardiovascular and Mediastinum   Hypertension, portal (HCC) - Primary   Relevant Medications   losartan (COZAAR) 50 MG tablet   nadolol (CORGARD) 20 MG tablet     Digestive   Chronic viral hepatitis C (HCC)     Other   Elevated glucose   Relevant Orders   HgB A1c      Meds ordered this encounter  Medications  . losartan (COZAAR) 50 MG tablet    Sig: Take 1 tablet (50 mg total) by mouth daily.    Dispense:  90 tablet    Refill:  3  . nadolol (CORGARD) 20 MG tablet    Sig: Take 1/2 tablet each PM    Dispense:  45 tablet    Refill:  3   1. Hypertension, portal (HCC)  - losartan (COZAAR) 50 MG tablet; Take 1 tablet (50 mg total) by mouth daily.  Dispense: 90 tablet; Refill: 3 - nadolol (CORGARD) 20 MG tablet; Take 1/2 tablet each PM  Dispense: 45 tablet; Refill: 3  2. Chronic hepatitis C without hepatic coma (HCC) Cont to see GI at [facility] as directed.  3. Elevated glucose       - HgB A1c

## 2017-04-28 ENCOUNTER — Encounter: Payer: Self-pay | Admitting: Family Medicine

## 2017-04-28 ENCOUNTER — Ambulatory Visit (INDEPENDENT_AMBULATORY_CARE_PROVIDER_SITE_OTHER): Payer: BLUE CROSS/BLUE SHIELD | Admitting: Family Medicine

## 2017-04-28 VITALS — BP 126/73 | HR 56 | Temp 98.3°F | Resp 16 | Ht 70.0 in | Wt 193.0 lb

## 2017-04-28 DIAGNOSIS — K766 Portal hypertension: Secondary | ICD-10-CM | POA: Diagnosis not present

## 2017-04-28 DIAGNOSIS — R7303 Prediabetes: Secondary | ICD-10-CM | POA: Diagnosis not present

## 2017-04-28 DIAGNOSIS — B182 Chronic viral hepatitis C: Secondary | ICD-10-CM | POA: Diagnosis not present

## 2017-04-28 DIAGNOSIS — B1920 Unspecified viral hepatitis C without hepatic coma: Secondary | ICD-10-CM

## 2017-04-28 DIAGNOSIS — K7460 Unspecified cirrhosis of liver: Secondary | ICD-10-CM

## 2017-04-28 DIAGNOSIS — I8510 Secondary esophageal varices without bleeding: Secondary | ICD-10-CM

## 2017-04-28 DIAGNOSIS — K7469 Other cirrhosis of liver: Secondary | ICD-10-CM

## 2017-04-28 MED ORDER — NADOLOL 20 MG PO TABS: 10.0000 mg | ORAL_TABLET | Freq: Every evening | ORAL | 3 refills | Status: DC

## 2017-04-28 NOTE — Assessment & Plan Note (Signed)
Well controlled, prior A1c 5s, now improved to 4.8 last 02/2017 Continue improved lifestyle Follow-up A1c 6-12 months

## 2017-04-28 NOTE — Assessment & Plan Note (Signed)
Stable, without complication. Continues on beta blocker Nadolol. Followed by [facility] for surveillance 

## 2017-04-28 NOTE — Patient Instructions (Signed)
Thank you for coming to the clinic today.  1. Keep up the good work - Continue Losartan 25mg  (may use up the 50s in future with HALF tablets), will send new rx of Losartan 25mg  - Continue Nadolol 20mg  nightly, sent REFILL for 45 tablets for 90 day supply and 3 refills, check with pharmacy, may be insurance issue on this one  Start checking BP occasionally, few times a month to keep track of it, if BP >140/90  Will check A1c Pre-Diabetes every 6-12 months  You will be due for FASTING BLOOD WORK (no food or drink after midnight before, only water or coffee without cream/sugar on the morning of)  - Please go ahead and schedule a "Lab Only" visit in the morning at the clinic for lab draw in 6 months, before next Annual Physical - Make sure Lab Only appointment is at least 1-2 weeks before your next appointment, so that results will be available  Please schedule a follow-up appointment with Dr. Althea CharonKaramalegos in 6 months Annual Physical  If you have any other questions or concerns, please feel free to call the clinic or send a message through MyChart. You may also schedule an earlier appointment if necessary.  Jesus PilarAlexander Karalynn Cottone, DO [facility]

## 2017-04-28 NOTE — Assessment & Plan Note (Addendum)
Stable, without complication. Continues on beta blocker Nadolol and ARB. Followed by [facility] for surveillance Adjusted dose today of Losartan instead of 50 may continue taking 25mg  as he had been previously, BP remains well controlled and has no systemic HTN diagnosis.

## 2017-04-28 NOTE — Progress Notes (Signed)
Subjective:    Patient ID: Jesus Mejia, male    DOB: Nov 23, 1956, 61 y.o.   MRN: [id number]  Jesus Mejia is a 61 y.o. male presenting on 04/28/2017 for Follow-up   HPI   Specialist: GI Hepatology - Dr Deniece Ree [facility])  CHRONIC HTN: Reports last visit PCP increased Losartan from 25 to 50mg , but he has not started taking the 50mg  and continues the 25 without problem Current Meds - Losartan 25mg  daily, Nadolol 20mg  (half tablet evening) - misses an evening dose occasionally 1 out of a week at times Reports good compliance, took meds today. Tolerating well, w/o complaints. Denies CP, dyspnea, HA, edema, dizziness / lightheadedness  Pre-Diabetes - Prior history of Pre DM with controlled A1c, chart review ranging 5.2 to 5.5, and most recent 02/2017 was at 4.8, has never taken medication for blood sugar. Has improved diet and lifestyle. - Additionally, no known history of Elevated Cholesterol or Hyperlipidemia  Hepatic Cirrhosis due to Chronic Hep C (treated s/p Harvoni) / Complicated by Esophageal Varices, Portal HTN - Reviewed prior history of chronic Hep C infection diagnosed in 2012 with complicated cirrhosis, he was treated with Harvoni by 2015 with complete resolution of HCV infection by report and chart review. Now he continues to follow-up with [facility] every 6 months for surveillance abdominal US, has not had any further problems. He does have complications from cirrhosis with Esophageal Varices and Portal HTN - He continues on Nadolol BB for varices and HR in past had tachycardia - Denies any abdominal pain, nausea, vomiting, fever, jaundice, swelling, confusion  Social History  Substance Use Topics  . Smoking status: Former Smoker    Packs/day: 1.00    Years: 20.00    Types: Cigarettes    Quit date: 09/08/1993  . Smokeless tobacco: Never Used  . Alcohol use No     Comment: former quit 14 years ago    Review of Systems Per HPI  unless specifically indicated above     Objective:    BP 126/73   Pulse (!) 56   Temp 98.3 F (36.8 C) (Oral)   Resp 16   Ht 5\' 10"  (1.778 m)   Wt 193 lb (87.5 kg)   BMI 27.69 kg/m   Wt Readings from Last 3 Encounters:  04/28/17 193 lb (87.5 kg)  02/23/17 188 lb (85.3 kg)  09/29/16 190 lb (86.2 kg)    Physical Exam  Constitutional: He is oriented to person, place, and time. He appears well-developed and well-nourished. No distress.  Well-appearing, comfortable, cooperative  HENT:  Head: Normocephalic and atraumatic.  Mouth/Throat: Oropharynx is clear and moist.  Eyes: Conjunctivae are normal.  Neck: Normal range of motion. Neck supple.  Cardiovascular: Normal rate, regular rhythm, normal heart sounds and intact distal pulses.   No murmur heard. Pulmonary/Chest: Effort normal and breath sounds normal. No respiratory distress. He has no wheezes. He has no rales.  Musculoskeletal: He exhibits no edema.  Neurological: He is alert and oriented to person, place, and time.  Skin: Skin is warm and dry. No rash noted. He is not diaphoretic. No erythema.  Psychiatric: He has a normal mood and affect. His behavior is normal.  Nursing note and vitals reviewed.    Results for orders placed or performed in visit on 02/23/17  HgB A1c  Result Value Ref Range   Hgb A1c MFr Bld 4.8 <5.7 %   Mean Plasma Glucose 91 mg/dL      Assessment &  Plan:   Problem List Items Addressed This Visit    Prediabetes    Well controlled, prior A1c 5s, now improved to 4.8 last 02/2017 Continue improved lifestyle Follow-up A1c 6-12 months      Portal hypertension (HCC) - Primary    Stable, without complication. Continues on beta blocker Nadolol and ARB. Followed by [facility] for surveillance Adjusted dose today of Losartan instead of 50 may continue taking 25mg  as he had been previously, BP remains well controlled and has no systemic HTN diagnosis.      Relevant Medications   losartan (COZAAR) 25 MG  tablet   nadolol (CORGARD) 20 MG tablet   Esophageal varices in cirrhosis (HCC)    Stable, without complication. Continues on beta blocker Nadolol. Followed by [facility] for surveillance      Relevant Medications   losartan (COZAAR) 25 MG tablet   nadolol (CORGARD) 20 MG tablet   Compensated cirrhosis related to hepatitis C virus (HCV) (HCC)    Stable, without recent changes, remains compensated. Complicated by esophageal varices and portal HTN. Continues on beta blocker Nadolol and ARB. Followed by [facility] for surveillance      RESOLVED: Chronic viral hepatitis C (HCC)      Meds ordered this encounter  Medications  . losartan (COZAAR) 25 MG tablet    Sig: Take 1 tablet (25 mg total) by mouth daily.    Dispense:  90 tablet    Refill:  3  . nadolol (CORGARD) 20 MG tablet    Sig: Take 0.5 tablets (10 mg total) by mouth every evening.    Dispense:  45 tablet    Refill:  3    Please dispense 90 day supply    Follow up plan: Return in about 6 months (around 10/29/2017) for Annual Physical.  Saralyn PilarAlexander Shantia Sanford, DO [facility] 04/28/2017, 1:06 PM

## 2017-04-28 NOTE — Assessment & Plan Note (Signed)
Stable, without recent changes, remains compensated. Complicated by esophageal varices and portal HTN. Continues on beta blocker Nadolol and ARB. Followed by [facility] for surveillance 

## 2017-05-02 ENCOUNTER — Other Ambulatory Visit: Payer: Self-pay | Admitting: Family Medicine

## 2017-05-02 DIAGNOSIS — Z0000 Encounter for general adult medical examination without abnormal findings: Secondary | ICD-10-CM

## 2017-05-02 DIAGNOSIS — K7469 Other cirrhosis of liver: Secondary | ICD-10-CM

## 2017-05-02 DIAGNOSIS — E785 Hyperlipidemia, unspecified: Secondary | ICD-10-CM | POA: Insufficient documentation

## 2017-05-02 DIAGNOSIS — E782 Mixed hyperlipidemia: Secondary | ICD-10-CM

## 2017-05-02 DIAGNOSIS — R7303 Prediabetes: Secondary | ICD-10-CM

## 2017-05-02 DIAGNOSIS — B1920 Unspecified viral hepatitis C without hepatic coma: Secondary | ICD-10-CM

## 2017-05-02 DIAGNOSIS — Z125 Encounter for screening for malignant neoplasm of prostate: Secondary | ICD-10-CM

## 2017-09-21 DIAGNOSIS — D696 Thrombocytopenia, unspecified: Secondary | ICD-10-CM | POA: Diagnosis not present

## 2017-09-21 DIAGNOSIS — I8510 Secondary esophageal varices without bleeding: Secondary | ICD-10-CM | POA: Diagnosis not present

## 2017-09-21 DIAGNOSIS — Z87891 Personal history of nicotine dependence: Secondary | ICD-10-CM | POA: Diagnosis not present

## 2017-09-21 DIAGNOSIS — K766 Portal hypertension: Secondary | ICD-10-CM | POA: Diagnosis not present

## 2017-09-21 DIAGNOSIS — R161 Splenomegaly, not elsewhere classified: Secondary | ICD-10-CM | POA: Diagnosis not present

## 2017-09-21 DIAGNOSIS — Z8619 Personal history of other infectious and parasitic diseases: Secondary | ICD-10-CM | POA: Diagnosis not present

## 2017-09-21 DIAGNOSIS — K7460 Unspecified cirrhosis of liver: Secondary | ICD-10-CM | POA: Diagnosis not present

## 2017-09-21 DIAGNOSIS — R001 Bradycardia, unspecified: Secondary | ICD-10-CM | POA: Diagnosis not present

## 2017-09-21 DIAGNOSIS — B182 Chronic viral hepatitis C: Secondary | ICD-10-CM | POA: Diagnosis not present

## 2017-10-30 ENCOUNTER — Other Ambulatory Visit: Payer: BLUE CROSS/BLUE SHIELD

## 2017-10-30 DIAGNOSIS — Z125 Encounter for screening for malignant neoplasm of prostate: Secondary | ICD-10-CM | POA: Diagnosis not present

## 2017-10-30 DIAGNOSIS — E782 Mixed hyperlipidemia: Secondary | ICD-10-CM | POA: Diagnosis not present

## 2017-10-30 DIAGNOSIS — R7303 Prediabetes: Secondary | ICD-10-CM | POA: Diagnosis not present

## 2017-10-30 DIAGNOSIS — B1920 Unspecified viral hepatitis C without hepatic coma: Secondary | ICD-10-CM | POA: Diagnosis not present

## 2017-10-31 LAB — LIPID PANEL
CHOL/HDL RATIO: 3.2 (calc) (ref <5.0)
Cholesterol: 124 mg/dL (ref <200)
HDL: 39 mg/dL — ABNORMAL LOW (ref >40)
LDL CHOLESTEROL (CALC): 68 mg/dL
NON-HDL CHOLESTEROL (CALC): 85 mg/dL (ref <130)
TRIGLYCERIDES: 86 mg/dL (ref <150)

## 2017-10-31 LAB — HEMOGLOBIN A1C
EAG (MMOL/L): 5.4 (calc)
HEMOGLOBIN A1C: 5 %{Hb} (ref <5.7)
MEAN PLASMA GLUCOSE: 97 (calc)

## 2017-10-31 LAB — PSA, TOTAL WITH REFLEX TO PSA, FREE: PSA, TOTAL: 0.3 ng/mL (ref <=4.0)

## 2017-11-02 ENCOUNTER — Encounter: Payer: Self-pay | Admitting: Family Medicine

## 2017-11-02 ENCOUNTER — Ambulatory Visit (INDEPENDENT_AMBULATORY_CARE_PROVIDER_SITE_OTHER): Payer: BLUE CROSS/BLUE SHIELD | Admitting: Family Medicine

## 2017-11-02 ENCOUNTER — Other Ambulatory Visit: Payer: Self-pay | Admitting: Family Medicine

## 2017-11-02 VITALS — BP 130/80 | HR 53 | Temp 98.5°F | Resp 16 | Ht 70.0 in | Wt 193.0 lb

## 2017-11-02 DIAGNOSIS — K7469 Other cirrhosis of liver: Secondary | ICD-10-CM | POA: Diagnosis not present

## 2017-11-02 DIAGNOSIS — Z0000 Encounter for general adult medical examination without abnormal findings: Secondary | ICD-10-CM

## 2017-11-02 DIAGNOSIS — B1920 Unspecified viral hepatitis C without hepatic coma: Secondary | ICD-10-CM

## 2017-11-02 DIAGNOSIS — R7303 Prediabetes: Secondary | ICD-10-CM

## 2017-11-02 DIAGNOSIS — H9191 Unspecified hearing loss, right ear: Secondary | ICD-10-CM | POA: Insufficient documentation

## 2017-11-02 DIAGNOSIS — K766 Portal hypertension: Secondary | ICD-10-CM

## 2017-11-02 DIAGNOSIS — D696 Thrombocytopenia, unspecified: Secondary | ICD-10-CM | POA: Diagnosis not present

## 2017-11-02 DIAGNOSIS — E782 Mixed hyperlipidemia: Secondary | ICD-10-CM

## 2017-11-02 DIAGNOSIS — Z125 Encounter for screening for malignant neoplasm of prostate: Secondary | ICD-10-CM

## 2017-11-02 DIAGNOSIS — K7460 Unspecified cirrhosis of liver: Secondary | ICD-10-CM

## 2017-11-02 DIAGNOSIS — I8510 Secondary esophageal varices without bleeding: Secondary | ICD-10-CM

## 2017-11-02 NOTE — Assessment & Plan Note (Signed)
Stable, without complication. Continues on beta blocker Nadolol and ARB. Followed by [facility] for surveillance  Plan: 1. Discussed BP and since no dx HTN may adjust ARB further in future, needs to check BP outside office home readings, bring in next time, can do trial on own self titrate down to half then off in future if doing well

## 2017-11-02 NOTE — Assessment & Plan Note (Signed)
Controlled cholesterol on lifestyle Last lipid panel 10/2017 Calculated ASCVD 10 yr risk score 6.9%  Plan: 1. No meds 2. Encourage improved lifestyle - low carb/cholesterol, reduce portion size, continue improving regular exercise 3. Follow-up lipids yearly

## 2017-11-02 NOTE — Patient Instructions (Addendum)
Thank you for coming to the clinic today.  1.  BP is improved. I think the Nadolol is very important to control heart rate and reduce risk of bleeding from the esophageal varices.  Keep checking BP regularly few times a week to document it well.  If BP is very well controlled < 135 / 85 regularly then we can try cutting Losartan in HALF daily for a while and then you can try to STOP it completely. Try to improve lifestyle exercise and low sodium as well to help.  DUE for FASTING BLOOD WORK (no food or drink after midnight before the lab appointment, only water or coffee without cream/sugar on the morning of)  SCHEDULE "Lab Only" visit in the morning at the clinic for lab draw in 1 YEAR  - Make sure Lab Only appointment is at about 1 week before your next appointment, so that results will be available  For Lab Results, once available within 2-3 days of blood draw, you can can log in to MyChart online to view your results and a brief explanation. Also, we can discuss results at next follow-up visit.  Please schedule a Follow-up Appointment to: Return in about 1 year (around 11/02/2018) for Annual Physical.  If you have any other questions or concerns, please feel free to call the clinic or send a message through MyChart. You may also schedule an earlier appointment if necessary.  Additionally, you may be receiving a survey about your experience at our clinic within a few days to 1 week by e-mail or mail. We value your feedback.  Saralyn PilarAlexander Lillianne Eick, DO [facility] [place] [facility]

## 2017-11-02 NOTE — Assessment & Plan Note (Signed)
Well-controlled Pre-DM with A1c 5.0 (stable from 4.8 to 5.2)  Plan:  1. Not on any therapy currently  2. Encourage improved lifestyle - low carb, low sugar diet, reduce portion size, continue improving regular exercise 3. Follow-up 1 yr

## 2017-11-02 NOTE — Assessment & Plan Note (Signed)
Stable, without recent changes, remains compensated. Complicated by esophageal varices and portal HTN. Continues on beta blocker Nadolol and ARB. Followed by [facility] for surveillance

## 2017-11-02 NOTE — Progress Notes (Signed)
Subjective:    Patient ID: Jesus Mejia, male    DOB: 1956/10/28, 61 y.o.   MRN: [id number]  Jesus Mejia is a 61 y.o. male presenting on 11/02/2017 for Annual Exam   HPI   Here for Annual Exam and Lab Results (states he was not fasting when had labs)  Specialist: GI Hepatology - Dr Deniece ReeJanet Jezsik ([facility]) - q 6 to 10 months  CHRONIC HTN: Reports concern if he has to take Losartan long term or can reduce. Not checking BP at home. Current Meds - Losartan 25mg  daily, Nadolol 20mg  (half tablet evening) Reports good compliance, took meds today. Tolerating well, w/o complaints.  Pre-Diabetes Reports no concerns CBGs: Not checking Meds: None Lifestyle: - Diet (Improved diet, reduced carb sugar)  - Exercise (Active outside in garden, and walking)  Hepatic Cirrhosis due to Chronic Hep C (treated s/p Harvoni) / Complicated by Esophageal Varices, Portal HTN - Reviewed prior history of chronic Hep C infection diagnosed in 2012 with complicated cirrhosis, he was treated with Harvoni by 2015 with complete resolution of HCV infection by report and chart review. Now he continues to follow-up with [facility] every 6 months for surveillance abdominal US, has not had any further problems. He does have complications from cirrhosis with Esophageal Varices and Portal HTN - He continues on Nadolol BB for varices and HR in past had tachycardia - Last visit with GI 08/2017, has had labs at that time, they will space his visits out to q 6-10 months now with US monitoring  Deafness Right Ear  Health Maintenance: - UTD Flu Shot 08/10/17 - UTD Colonoscopy - UTD TDap Prostate CA Screening: Prior PSA / DRE reported normal. Last PSA 0.3 (10/2017). Currently asymptomatic. No known family history of prostate CA.   Depression screen [facility] 2/9 11/02/2017 02/23/2017 09/29/2016  Decreased Interest 0 0 0  Down, Depressed, Hopeless 0 0 0  PHQ - 2 Score 0 0 0    Past Medical  History:  Diagnosis Date  . Acute hepatitis C   . Bleeding nose   . Chronic fatigue   . Cirrhosis, non-alcoholic (HCC)   . Elevated serum glucose    Past Surgical History:  Procedure Laterality Date  . HERNIA REPAIR     Social History   Socioeconomic History  . Marital status: Married    Spouse name: Not on file  . Number of children: Not on file  . Years of education: Not on file  . Highest education level: Not on file  Social Needs  . Financial resource strain: Not on file  . Food insecurity - worry: Not on file  . Food insecurity - inability: Not on file  . Transportation needs - medical: Not on file  . Transportation needs - non-medical: Not on file  Occupational History  . Not on file  Tobacco Use  . Smoking status: Former Smoker    Packs/day: 1.00    Years: 20.00    Pack years: 20.00    Types: Cigarettes    Last attempt to quit: 09/08/1993    Years since quitting: 24.1  . Smokeless tobacco: Former Engineer, waterUser  Substance and Sexual Activity  . Alcohol use: No    Alcohol/week: 0.0 oz    Comment: former quit 14 years ago  . Drug use: No    Comment: former quit 15 years ago  . Sexual activity: Not on file  Other Topics Concern  . Not on file  Social History Narrative  .  Not on file   Family History  Problem Relation Age of Onset  . Cancer Mother        breast  . Cancer Sister        breast  . Hypertension Brother    Current Outpatient Medications on File Prior to Visit  Medication Sig  . calcium & magnesium carbonates (MYLANTA) 311-232 MG per tablet Take 1 tablet by mouth daily as needed.   Marland Kitchen. losartan (COZAAR) 25 MG tablet Take 1 tablet (25 mg total) by mouth daily.  . Magnesium 250 MG TABS Take by mouth.  . Methylsulfonylmethane 1000 MG CAPS Take 1 capsule by mouth daily as needed.  . Methylsulfonylmethane 1000 MG TABS Take 1 tablet by mouth daily.  . Multiple Vitamin (MULTIVITAMIN WITH MINERALS) TABS tablet Take 1 tablet by mouth daily.  . nadolol  (CORGARD) 20 MG tablet Take 0.5 tablets (10 mg total) by mouth every evening.  . polyethylene glycol powder (GLYCOLAX/MIRALAX) powder TAKE 8.5 G BY MOUTH ONCE DAILY FOR 3 DAYS. MIX IN 4-8OUNCES OF FLUID PRIOR TO TAKING.   No current facility-administered medications on file prior to visit.     Review of Systems  Constitutional: Negative for activity change, appetite change, chills, diaphoresis, fatigue and fever.  HENT: Negative for congestion and hearing loss.   Eyes: Negative for visual disturbance.  Respiratory: Negative for cough, chest tightness, shortness of breath and wheezing.   Cardiovascular: Negative for chest pain, palpitations and leg swelling.  Gastrointestinal: Negative for abdominal pain, constipation, diarrhea, nausea and vomiting.  Genitourinary: Negative for dysuria, frequency and hematuria.  Musculoskeletal: Negative for arthralgias and neck pain.  Skin: Negative for rash.  Neurological: Negative for dizziness, weakness, light-headedness, numbness and headaches.  Hematological: Negative for adenopathy.  Psychiatric/Behavioral: Negative for behavioral problems, dysphoric mood and sleep disturbance.   Per HPI unless specifically indicated above     Objective:    BP 130/80 (BP Location: Left Arm, Cuff Size: Normal)   Pulse (!) 53   Temp 98.5 F (36.9 C) (Oral)   Resp 16   Ht 5\' 10"  (1.778 m)   Wt 193 lb (87.5 kg)   BMI 27.69 kg/m   Wt Readings from Last 3 Encounters:  11/02/17 193 lb (87.5 kg)  04/28/17 193 lb (87.5 kg)  02/23/17 188 lb (85.3 kg)    Physical Exam  Constitutional: He is oriented to person, place, and time. He appears well-developed and well-nourished. No distress.  Well-appearing, comfortable, cooperative  HENT:  Head: Normocephalic and atraumatic.  Mouth/Throat: Oropharynx is clear and moist.  Frontal / maxillary sinuses non-tender. Nares patent without purulence or edema. R TM obscured by non impacting large piece dry cerumen. L TM clear  without erythema, effusion or bulging. Deaf in R ear.  Oropharynx clear without erythema, exudates, edema or asymmetry.  Eyes: Conjunctivae and EOM are normal. Pupils are equal, round, and reactive to light. Right eye exhibits no discharge. Left eye exhibits no discharge.  Neck: Normal range of motion. Neck supple. No thyromegaly present.  Cardiovascular: Normal rate, regular rhythm, normal heart sounds and intact distal pulses.  No murmur heard. Pulmonary/Chest: Effort normal and breath sounds normal. No respiratory distress. He has no wheezes. He has no rales.  Abdominal: Soft. Bowel sounds are normal. He exhibits no distension and no mass. There is no tenderness.  Genitourinary:  Genitourinary Comments: Defer DRE  Musculoskeletal: Normal range of motion. He exhibits no edema or tenderness.  Upper / Lower Extremities: - Normal muscle tone, strength  bilateral upper extremities 5/5, lower extremities 5/5  Lymphadenopathy:    He has no cervical adenopathy.  Neurological: He is alert and oriented to person, place, and time.  Distal sensation intact to light touch all extremities  Skin: Skin is warm and dry. No rash noted. He is not diaphoretic. No erythema.  Psychiatric: He has a normal mood and affect. His behavior is normal.  Well groomed, good eye contact, normal speech and thoughts  Nursing note and vitals reviewed.  Results for orders placed or performed in visit on 05/02/17  Hemoglobin A1c  Result Value Ref Range   Hgb A1c MFr Bld 5.0 <5.7 % of total Hgb   Mean Plasma Glucose 97 (calc)   eAG (mmol/L) 5.4 (calc)  Lipid panel  Result Value Ref Range   Cholesterol 124 <200 mg/dL   HDL 39 (L) >10 mg/dL   Triglycerides 86 <960 mg/dL   LDL Cholesterol (Calc) 68 mg/dL (calc)   Total CHOL/HDL Ratio 3.2 <5.0 (calc)   Non-HDL Cholesterol (Calc) 85 <454 mg/dL (calc)  PSA, Total with Reflex to PSA, Free  Result Value Ref Range   PSA, Total 0.3 < OR = 4.0 ng/mL      Assessment &  Plan:   Problem List Items Addressed This Visit    Compensated cirrhosis related to hepatitis C virus (HCV) (HCC)    Stable, without recent changes, remains compensated. Complicated by esophageal varices and portal HTN. Continues on beta blocker Nadolol and ARB. Followed by [facility] for surveillance      Deafness in right ear   Hyperlipidemia    Controlled cholesterol on lifestyle Last lipid panel 10/2017 Calculated ASCVD 10 yr risk score 6.9%  Plan: 1. No meds 2. Encourage improved lifestyle - low carb/cholesterol, reduce portion size, continue improving regular exercise 3. Follow-up lipids yearly      Portal hypertension (HCC)    Stable, without complication. Continues on beta blocker Nadolol and ARB. Followed by [facility] for surveillance  Plan: 1. Discussed BP and since no dx HTN may adjust ARB further in future, needs to check BP outside office home readings, bring in next time, can do trial on own self titrate down to half then off in future if doing well      Prediabetes    Well-controlled Pre-DM with A1c 5.0 (stable from 4.8 to 5.2)  Plan:  1. Not on any therapy currently  2. Encourage improved lifestyle - low carb, low sugar diet, reduce portion size, continue improving regular exercise 3. Follow-up 1 yr      Thrombocytopenia (HCC)    Secondary to cirrhosis       Other Visit Diagnoses    Annual physical exam    -  Primary      Meds ordered this encounter  Medications  . polyethylene glycol powder (GLYCOLAX/MIRALAX) powder    Sig: TAKE 8.5 G BY MOUTH ONCE DAILY FOR 3 DAYS. MIX IN 4-8OUNCES OF FLUID PRIOR TO TAKING.    Refill:  3    Follow up plan: Return in about 1 year (around 11/02/2018) for Annual Physical.  Future fasting labs ordered for 11/01/18.  Saralyn Pilar, DO [facility] 11/02/2017, 4:08 PM

## 2017-11-02 NOTE — Assessment & Plan Note (Signed)
Secondary to cirrhosis

## 2018-05-24 DIAGNOSIS — K7460 Unspecified cirrhosis of liver: Secondary | ICD-10-CM | POA: Diagnosis not present

## 2018-05-24 DIAGNOSIS — K766 Portal hypertension: Secondary | ICD-10-CM | POA: Diagnosis not present

## 2018-05-24 DIAGNOSIS — B182 Chronic viral hepatitis C: Secondary | ICD-10-CM | POA: Diagnosis not present

## 2018-07-31 ENCOUNTER — Encounter: Payer: Self-pay | Admitting: Family Medicine

## 2018-07-31 LAB — HM DIABETES EYE EXAM

## 2018-11-01 ENCOUNTER — Other Ambulatory Visit: Payer: BLUE CROSS/BLUE SHIELD

## 2018-11-05 ENCOUNTER — Other Ambulatory Visit: Payer: BLUE CROSS/BLUE SHIELD

## 2018-11-05 DIAGNOSIS — B1920 Unspecified viral hepatitis C without hepatic coma: Secondary | ICD-10-CM

## 2018-11-05 DIAGNOSIS — K766 Portal hypertension: Secondary | ICD-10-CM

## 2018-11-05 DIAGNOSIS — D696 Thrombocytopenia, unspecified: Secondary | ICD-10-CM | POA: Diagnosis not present

## 2018-11-05 DIAGNOSIS — E782 Mixed hyperlipidemia: Secondary | ICD-10-CM

## 2018-11-05 DIAGNOSIS — Z0000 Encounter for general adult medical examination without abnormal findings: Secondary | ICD-10-CM

## 2018-11-05 DIAGNOSIS — K7469 Other cirrhosis of liver: Secondary | ICD-10-CM

## 2018-11-05 DIAGNOSIS — R7303 Prediabetes: Secondary | ICD-10-CM

## 2018-11-06 LAB — COMPLETE METABOLIC PANEL WITH GFR
AG Ratio: 2.2 (calc) (ref 1.0–2.5)
ALT: 19 U/L (ref 9–46)
AST: 27 U/L (ref 10–35)
Albumin: 4.7 g/dL (ref 3.6–5.1)
Alkaline phosphatase (APISO): 51 U/L (ref 40–115)
BUN: 10 mg/dL (ref 7–25)
CALCIUM: 9.9 mg/dL (ref 8.6–10.3)
CO2: 29 mmol/L (ref 20–32)
CREATININE: 0.78 mg/dL (ref 0.70–1.25)
Chloride: 105 mmol/L (ref 98–110)
GFR, EST AFRICAN AMERICAN: 114 mL/min/{1.73_m2} (ref >=60)
GFR, EST NON AFRICAN AMERICAN: 98 mL/min/{1.73_m2} (ref >=60)
GLUCOSE: 123 mg/dL — AB (ref 65–99)
Globulin: 2.1 g/dL (calc) (ref 1.9–3.7)
Potassium: 4 mmol/L (ref 3.5–5.3)
Sodium: 141 mmol/L (ref 135–146)
TOTAL PROTEIN: 6.8 g/dL (ref 6.1–8.1)
Total Bilirubin: 2.4 mg/dL — ABNORMAL HIGH (ref 0.2–1.2)

## 2018-11-06 LAB — CBC WITH DIFFERENTIAL/PLATELET
BASOS PCT: 0.7 %
Basophils Absolute: 31 cells/uL (ref 0–200)
EOS PCT: 1.8 %
Eosinophils Absolute: 79 cells/uL (ref 15–500)
HCT: 44.5 % (ref 38.5–50.0)
HEMOGLOBIN: 15.9 g/dL (ref 13.2–17.1)
Lymphs Abs: 1536 cells/uL (ref 850–3900)
MCH: 30.8 pg (ref 27.0–33.0)
MCHC: 35.7 g/dL (ref 32.0–36.0)
MCV: 86.1 fL (ref 80.0–100.0)
MPV: 10.8 fL (ref 7.5–12.5)
Monocytes Relative: 9.5 %
NEUTROS ABS: 2336 {cells}/uL (ref 1500–7800)
Neutrophils Relative %: 53.1 %
PLATELETS: 105 10*3/uL — AB (ref 140–400)
RBC: 5.17 10*6/uL (ref 4.20–5.80)
RDW: 13.2 % (ref 11.0–15.0)
TOTAL LYMPHOCYTE: 34.9 %
WBC mixed population: 418 cells/uL (ref 200–950)
WBC: 4.4 10*3/uL (ref 3.8–10.8)

## 2018-11-06 LAB — LIPID PANEL
Cholesterol: 140 mg/dL (ref <200)
HDL: 39 mg/dL — AB (ref >40)
LDL Cholesterol (Calc): 82 mg/dL (calc)
Non-HDL Cholesterol (Calc): 101 mg/dL (calc) (ref <130)
Total CHOL/HDL Ratio: 3.6 (calc) (ref <5.0)
Triglycerides: 92 mg/dL (ref <150)

## 2018-11-06 LAB — HEMOGLOBIN A1C
Hgb A1c MFr Bld: 5.1 % of total Hgb (ref <5.7)
Mean Plasma Glucose: 100 (calc)
eAG (mmol/L): 5.5 (calc)

## 2018-11-07 ENCOUNTER — Encounter: Payer: Self-pay | Admitting: Family Medicine

## 2018-11-07 ENCOUNTER — Ambulatory Visit (INDEPENDENT_AMBULATORY_CARE_PROVIDER_SITE_OTHER): Payer: BLUE CROSS/BLUE SHIELD | Admitting: Family Medicine

## 2018-11-07 ENCOUNTER — Other Ambulatory Visit: Payer: Self-pay | Admitting: Family Medicine

## 2018-11-07 VITALS — BP 144/84 | HR 59 | Temp 97.8°F | Resp 16 | Ht 71.0 in | Wt 190.0 lb

## 2018-11-07 DIAGNOSIS — B1920 Unspecified viral hepatitis C without hepatic coma: Secondary | ICD-10-CM

## 2018-11-07 DIAGNOSIS — R7303 Prediabetes: Secondary | ICD-10-CM

## 2018-11-07 DIAGNOSIS — D696 Thrombocytopenia, unspecified: Secondary | ICD-10-CM

## 2018-11-07 DIAGNOSIS — Z0000 Encounter for general adult medical examination without abnormal findings: Secondary | ICD-10-CM

## 2018-11-07 DIAGNOSIS — K7469 Other cirrhosis of liver: Secondary | ICD-10-CM

## 2018-11-07 DIAGNOSIS — K7460 Unspecified cirrhosis of liver: Secondary | ICD-10-CM

## 2018-11-07 DIAGNOSIS — E782 Mixed hyperlipidemia: Secondary | ICD-10-CM

## 2018-11-07 DIAGNOSIS — K766 Portal hypertension: Secondary | ICD-10-CM | POA: Diagnosis not present

## 2018-11-07 DIAGNOSIS — Z23 Encounter for immunization: Secondary | ICD-10-CM | POA: Diagnosis not present

## 2018-11-07 DIAGNOSIS — I8510 Secondary esophageal varices without bleeding: Secondary | ICD-10-CM

## 2018-11-07 DIAGNOSIS — Z125 Encounter for screening for malignant neoplasm of prostate: Secondary | ICD-10-CM

## 2018-11-07 MED ORDER — LOSARTAN POTASSIUM 25 MG PO TABS: 25.0000 mg | ORAL_TABLET | Freq: Every day | ORAL | 3 refills | Status: DC

## 2018-11-07 NOTE — Patient Instructions (Addendum)
Thank you for coming to the office today.  Lab results overall are good! A1c 5.1, well controlled, not in range of Pre Diabetes - keep up the good work!  BP was elevated. Restarted Losartan 25mg  daily - keep track of readings if possible, goal is < 140/90  Will follow-up with GI results from visit soon.  Flu shot today.  DUE for FASTING BLOOD WORK (no food or drink after midnight before the lab appointment, only water or coffee without cream/sugar on the morning of)  SCHEDULE "Lab Only" visit in the morning at the clinic for lab draw in 1 YEAR  - Make sure Lab Only appointment is at about 1 week before your next appointment, so that results will be available  For Lab Results, once available within 2-3 days of blood draw, you can can log in to MyChart online to view your results and a brief explanation. Also, we can discuss results at next follow-up visit.   Please schedule a Follow-up Appointment to: Return in about 1 year (around 11/08/2019) for Annual Physical.  If you have any other questions or concerns, please feel free to call the office or send a message through MyChart. You may also schedule an earlier appointment if necessary.  Additionally, you may be receiving a survey about your experience at our office within a few days to 1 week by e-mail or mail. We value your feedback.  Saralyn PilarAlexander Deloria Brassfield, DO [facility]

## 2018-11-07 NOTE — Assessment & Plan Note (Signed)
Controlled cholesterol on lifestyle Last lipid panel 09/2018 Calculated ASCVD 10 yr risk score 6.9%  Plan: 1. No meds 2. Encourage improved lifestyle - low carb/cholesterol, reduce portion size, continue improving regular exercise 3. Follow-up lipids yearly

## 2018-11-07 NOTE — Assessment & Plan Note (Signed)
Stable, without recent changes, remains compensated. Complicated by esophageal varices and portal HTN. Continues on beta blocker Nadolol and restarted ARB low dose. Followed by [facility] for surveillance

## 2018-11-07 NOTE — Assessment & Plan Note (Signed)
Secondary to cirrhosis Stable mild low plt

## 2018-11-07 NOTE — Progress Notes (Signed)
Subjective:    Patient ID: Jesus Mejia, male    DOB: [birth year], 62 y.o.   MRN: [id number]  Jesus Mejia is a 62 y.o. male presenting on 11/07/2018 for Annual Exam  Accompanied by spouse, Diesel Lina, who provides additional history.  HPI   Here for Annual Exam and Lab Results  Specialist: GI Hepatology - Dr Deniece Ree [facility])  CHRONIC HTN: Reports concern if he has to take Losartan long term or can reduce. He checks BP at home occasionally 140s/80s. Current Meds -Losartan 25mg  daily (OFF currently ran out - tried to hold), Nadolol 20mg  (half tablet evening) Reports good compliance, took meds today. Tolerating well, w/o complaints  Pre-Diabetes Prior history years ago with PreDM, last several readings have been normal, last reading A1c 5.1 CBGs: Not checking Meds: None Lifestyle: - Diet (still follows improved diet, reduced carb sugar)  - Exercise (Active outside regularly, walking)  Hepatic Cirrhosis due to Chronic Hep C (treated s/p Harvoni) / Complicated by Esophageal Varices, Portal HTN - Background history with prior history of chronic Hep C infection diagnosed in 2012 with complicated cirrhosis, he was treated with Harvoni by 2015 with complete resolution of HCV infection by report and chart review - Followed by [facility] Dr Georgina Snell, upcoming appointment in November 2019. They will do surveillance abdominal US - He does have complications from cirrhosis with Esophageal Varices and Portal HTN - He continues on Nadolol BB for varices and HR in past had tachycardia  PMH - Deafness Right Ear  Health Maintenance: Due for Flu Shot, will receive today   - UTD Colonoscopy - followed by [facility]. - UTD TDap  Prostate CA Screening: Prior PSA / DRE reported normal. Last PSA 0.3 (10/2017). Currently asymptomatic. No known family history of prostate CA. Did not have PSA screening this year, he agrees to lab test for next year  2020.   Depression screen [facility] 2/9 11/07/2018 11/02/2017 02/23/2017  Decreased Interest 0 0 0  Down, Depressed, Hopeless 0 0 0  PHQ - 2 Score 0 0 0    Past Medical History:  Diagnosis Date  . Acute hepatitis C   . Bleeding nose   . Chronic fatigue   . Cirrhosis, non-alcoholic (HCC)   . Elevated serum glucose    Past Surgical History:  Procedure Laterality Date  . HERNIA REPAIR     Social History   Socioeconomic History  . Marital status: Married    Spouse name: Not on file  . Number of children: Not on file  . Years of education: Not on file  . Highest education level: Not on file  Occupational History  . Not on file  Social Needs  . Financial resource strain: Not on file  . Food insecurity:    Worry: Not on file    Inability: Not on file  . Transportation needs:    Medical: Not on file    Non-medical: Not on file  Tobacco Use  . Smoking status: Former Smoker    Packs/day: 1.00    Years: 20.00    Pack years: 20.00    Types: Cigarettes    Last attempt to quit: 09/08/1993    Years since quitting: 25.1  . Smokeless tobacco: Former Engineer, water and Sexual Activity  . Alcohol use: No    Alcohol/week: 0.0 standard drinks    Comment: former quit 14 years ago  . Drug use: No    Comment: former quit 15 years ago  .  Sexual activity: Not on file  Lifestyle  . Physical activity:    Days per week: Not on file    Minutes per session: Not on file  . Stress: Not on file  Relationships  . Social connections:    Talks on phone: Not on file    Gets together: Not on file    Attends religious service: Not on file    Active member of club or organization: Not on file    Attends meetings of clubs or organizations: Not on file    Relationship status: Not on file  . Intimate partner violence:    Fear of current or ex partner: Not on file    Emotionally abused: Not on file    Physically abused: Not on file    Forced sexual activity: Not on file  Other Topics Concern  .  Not on file  Social History Narrative  . Not on file   Family History  Problem Relation Age of Onset  . Cancer Mother        breast  . Cancer Sister        breast  . Hypertension Brother   . Prostate cancer Neg Hx    Current Outpatient Medications on File Prior to Visit  Medication Sig  . calcium & magnesium carbonates (MYLANTA) 311-232 MG per tablet Take 1 tablet by mouth daily as needed.   . Magnesium 250 MG TABS Take by mouth.  . Methylsulfonylmethane 1000 MG CAPS Take 1 capsule by mouth daily as needed.  . Multiple Vitamin (MULTIVITAMIN WITH MINERALS) TABS tablet Take 1 tablet by mouth daily.  . nadolol (CORGARD) 20 MG tablet Take 0.5 tablets (10 mg total) by mouth every evening.  . polyethylene glycol powder (GLYCOLAX/MIRALAX) powder TAKE 8.5 G BY MOUTH ONCE DAILY FOR 3 DAYS. MIX IN 4-8OUNCES OF FLUID PRIOR TO TAKING.   No current facility-administered medications on file prior to visit.     Review of Systems  Constitutional: Negative for activity change, appetite change, chills, diaphoresis, fatigue and fever.  HENT: Negative for congestion and hearing loss.   Eyes: Negative for visual disturbance.  Respiratory: Negative for apnea, cough, choking, chest tightness, shortness of breath and wheezing.   Cardiovascular: Negative for chest pain, palpitations and leg swelling.  Gastrointestinal: Negative for abdominal pain, anal bleeding, blood in stool, constipation, diarrhea, nausea and vomiting.  Endocrine: Negative for cold intolerance.  Genitourinary: Negative for decreased urine volume, difficulty urinating, dysuria, frequency, hematuria, testicular pain and urgency.  Musculoskeletal: Negative for arthralgias, back pain and neck pain.  Skin: Negative for rash.  Allergic/Immunologic: Negative for environmental allergies.  Neurological: Negative for dizziness, weakness, light-headedness, numbness and headaches.  Hematological: Negative for adenopathy.    Psychiatric/Behavioral: Negative for behavioral problems, dysphoric mood and sleep disturbance. The patient is not nervous/anxious.    Per HPI unless specifically indicated above     Objective:    BP (!) 144/84 (BP Location: Left Arm, Cuff Size: Normal)   Pulse (!) 59   Temp 97.8 F (36.6 C) (Oral)   Resp 16   Ht 5\' 11"  (1.803 m)   Wt 190 lb (86.2 kg)   BMI 26.50 kg/m   Wt Readings from Last 3 Encounters:  11/07/18 190 lb (86.2 kg)  11/02/17 193 lb (87.5 kg)  04/28/17 193 lb (87.5 kg)    Physical Exam  Constitutional: He is oriented to person, place, and time. He appears well-developed and well-nourished. No distress.  Well-appearing, comfortable, cooperative  HENT:  Head: Normocephalic and atraumatic.  Mouth/Throat: Oropharynx is clear and moist.  Frontal / maxillary sinuses non-tender. Nares patent without purulence or edema.   R TM obscured by dry impacted cerumen. Deaf in R ear, chronically.  L TM clear without erythema, effusion or bulging  Oropharynx clear without erythema, exudates, edema or asymmetry.  Eyes: Pupils are equal, round, and reactive to light. Conjunctivae and EOM are normal. Right eye exhibits no discharge. Left eye exhibits no discharge.  Neck: Normal range of motion. Neck supple. No thyromegaly present.  Cardiovascular: Normal rate, regular rhythm, normal heart sounds and intact distal pulses.  No murmur heard. Pulmonary/Chest: Effort normal and breath sounds normal. No respiratory distress. He has no wheezes. He has no rales.  Abdominal: Soft. Bowel sounds are normal. He exhibits no distension and no mass. There is no tenderness.  Genitourinary:  Genitourinary Comments: Defer DRE  Musculoskeletal: Normal range of motion. He exhibits no edema or tenderness.  Upper / Lower Extremities: - Normal muscle tone, strength bilateral upper extremities 5/5, lower extremities 5/5  Lymphadenopathy:    He has no cervical adenopathy.  Neurological: He is  alert and oriented to person, place, and time.  Distal sensation intact to light touch all extremities  Skin: Skin is warm and dry. No rash noted. He is not diaphoretic. No erythema.  Psychiatric: He has a normal mood and affect. His behavior is normal.  Well groomed, good eye contact, normal speech and thoughts  Nursing note and vitals reviewed.  Results for orders placed or performed in visit on 11/05/18  CBC with Differential/Platelet  Result Value Ref Range   WBC 4.4 3.8 - 10.8 Thousand/uL   RBC 5.17 4.20 - 5.80 Million/uL   Hemoglobin 15.9 13.2 - 17.1 g/dL   HCT 16.1 09.6 - 04.5 %   MCV 86.1 80.0 - 100.0 fL   MCH 30.8 27.0 - 33.0 pg   MCHC 35.7 32.0 - 36.0 g/dL   RDW 40.9 81.1 - 91.4 %   Platelets 105 (L) 140 - 400 Thousand/uL   MPV 10.8 7.5 - 12.5 fL   Neutro Abs 2,336 1,500 - 7,800 cells/uL   Lymphs Abs 1,536 850 - 3,900 cells/uL   WBC mixed population 418 200 - 950 cells/uL   Eosinophils Absolute 79 15 - 500 cells/uL   Basophils Absolute 31 0 - 200 cells/uL   Neutrophils Relative % 53.1 %   Total Lymphocyte 34.9 %   Monocytes Relative 9.5 %   Eosinophils Relative 1.8 %   Basophils Relative 0.7 %  Lipid panel  Result Value Ref Range   Cholesterol 140 <200 mg/dL   HDL 39 (L) >78 mg/dL   Triglycerides 92 <295 mg/dL   LDL Cholesterol (Calc) 82 mg/dL (calc)   Total CHOL/HDL Ratio 3.6 <5.0 (calc)   Non-HDL Cholesterol (Calc) 101 <130 mg/dL (calc)  Hemoglobin A2Z  Result Value Ref Range   Hgb A1c MFr Bld 5.1 <5.7 % of total Hgb   Mean Plasma Glucose 100 (calc)   eAG (mmol/L) 5.5 (calc)  COMPLETE METABOLIC PANEL WITH GFR  Result Value Ref Range   Glucose, Bld 123 (H) 65 - 99 mg/dL   BUN 10 7 - 25 mg/dL   Creat 3.08 6.57 - 8.46 mg/dL   GFR, Est Non African American 98 > OR = 60 mL/min/1.38m2   GFR, Est African American 114 > OR = 60 mL/min/1.13m2   BUN/Creatinine Ratio NOT APPLICABLE 6 - 22 (calc)   Sodium 141 135 - 146 mmol/L  Potassium 4.0 3.5 - 5.3 mmol/L    Chloride 105 98 - 110 mmol/L   CO2 29 20 - 32 mmol/L   Calcium 9.9 8.6 - 10.3 mg/dL   Total Protein 6.8 6.1 - 8.1 g/dL   Albumin 4.7 3.6 - 5.1 g/dL   Globulin 2.1 1.9 - 3.7 g/dL (calc)   AG Ratio 2.2 1.0 - 2.5 (calc)   Total Bilirubin 2.4 (H) 0.2 - 1.2 mg/dL   Alkaline phosphatase (APISO) 51 40 - 115 U/L   AST 27 10 - 35 U/L   ALT 19 9 - 46 U/L      Assessment & Plan:   Problem List Items Addressed This Visit    Compensated cirrhosis related to hepatitis C virus (HCV) (HCC)    Stable, without recent changes, remains compensated. Complicated by esophageal varices and portal HTN. Continues on beta blocker Nadolol and restarted ARB low dose. Followed by [facility] for surveillance      Esophageal varices in cirrhosis (HCC)    Stable, without complication. Continues on beta blocker Nadolol. Followed by [facility] for surveillance      Relevant Medications   losartan (COZAAR) 25 MG tablet   Hyperlipidemia    Controlled cholesterol on lifestyle Last lipid panel 09/2018 Calculated ASCVD 10 yr risk score 6.9%  Plan: 1. No meds 2. Encourage improved lifestyle - low carb/cholesterol, reduce portion size, continue improving regular exercise 3. Follow-up lipids yearly      Relevant Medications   losartan (COZAAR) 25 MG tablet   Portal hypertension (HCC)    Mild elevated BP off ARB trial Otherwise stable, without complication Continues on beta blocker Nadolol Followed by [facility] for surveillance  Plan: 1. Restart ARB - Losartan 25mg  daily - new rx sent. Previously had been on this but stopped when rx ran out to see if still needed it      Relevant Medications   losartan (COZAAR) 25 MG tablet   Prediabetes    Well-controlled Pre-DM with A1c 5.1  Plan:  1. Not on any therapy currently  2. Encourage improved lifestyle - low carb, low sugar diet, reduce portion size, continue improving regular exercise 3. Follow-up 1 yr      Thrombocytopenia (HCC)    Secondary to  cirrhosis Stable mild low plt       Other Visit Diagnoses    Annual physical exam    -  Primary   Needs flu shot       Relevant Orders   Flu Vaccine QUAD 6+ mos PF IM (Fluarix Quad PF) (Completed)     Updated Health Maintenance information Reviewed recent lab results with patient Encouraged improvement to lifestyle with diet and exercise   Meds ordered this encounter  Medications  . losartan (COZAAR) 25 MG tablet    Sig: Take 1 tablet (25 mg total) by mouth daily.    Dispense:  90 tablet    Refill:  3   Follow up plan: Return in about 1 year (around 11/08/2019) for Annual Physical.  Future lab orders for 10/2019  Saralyn Pilar, DO [facility] 11/07/2018, 9:21 AM

## 2018-11-07 NOTE — Assessment & Plan Note (Signed)
Mild elevated BP off ARB trial Otherwise stable, without complication Continues on beta blocker Nadolol Followed by [facility] for surveillance  Plan: 1. Restart ARB - Losartan 25mg  daily - new rx sent. Previously had been on this but stopped when rx ran out to see if still needed it

## 2018-11-07 NOTE — Assessment & Plan Note (Signed)
Stable, without complication. Continues on beta blocker Nadolol. Followed by [facility] for surveillance 

## 2018-11-07 NOTE — Assessment & Plan Note (Signed)
Well-controlled Pre-DM with A1c 5.1  Plan:  1. Not on any therapy currently  2. Encourage improved lifestyle - low carb, low sugar diet, reduce portion size, continue improving regular exercise 3. Follow-up 1 yr

## 2018-11-29 DIAGNOSIS — K7460 Unspecified cirrhosis of liver: Secondary | ICD-10-CM | POA: Diagnosis not present

## 2018-11-29 DIAGNOSIS — I8510 Secondary esophageal varices without bleeding: Secondary | ICD-10-CM | POA: Diagnosis not present

## 2018-11-29 DIAGNOSIS — D696 Thrombocytopenia, unspecified: Secondary | ICD-10-CM | POA: Diagnosis not present

## 2018-11-29 DIAGNOSIS — B182 Chronic viral hepatitis C: Secondary | ICD-10-CM | POA: Diagnosis not present

## 2018-11-29 DIAGNOSIS — K766 Portal hypertension: Secondary | ICD-10-CM | POA: Diagnosis not present

## 2018-11-29 DIAGNOSIS — R161 Splenomegaly, not elsewhere classified: Secondary | ICD-10-CM | POA: Diagnosis not present

## 2018-11-29 DIAGNOSIS — R001 Bradycardia, unspecified: Secondary | ICD-10-CM | POA: Diagnosis not present

## 2018-11-29 DIAGNOSIS — Z87891 Personal history of nicotine dependence: Secondary | ICD-10-CM | POA: Diagnosis not present

## 2018-11-29 DIAGNOSIS — Z23 Encounter for immunization: Secondary | ICD-10-CM | POA: Diagnosis not present

## 2018-12-31 DIAGNOSIS — Z23 Encounter for immunization: Secondary | ICD-10-CM | POA: Diagnosis not present

## 2019-11-11 ENCOUNTER — Other Ambulatory Visit: Payer: Self-pay

## 2019-11-11 ENCOUNTER — Ambulatory Visit (LOCAL_COMMUNITY_HEALTH_CENTER): Payer: Self-pay

## 2019-11-11 DIAGNOSIS — Z23 Encounter for immunization: Secondary | ICD-10-CM

## 2019-11-11 NOTE — Progress Notes (Signed)
Flu vaccine given; tolerated well Blenda Wisecup, RN  

## 2019-11-15 ENCOUNTER — Encounter: Payer: BLUE CROSS/BLUE SHIELD | Admitting: Family Medicine

## 2020-05-22 ENCOUNTER — Other Ambulatory Visit: Payer: Self-pay | Admitting: Family Medicine

## 2020-05-22 DIAGNOSIS — K766 Portal hypertension: Secondary | ICD-10-CM

## 2020-05-22 NOTE — Telephone Encounter (Signed)
Requested medication (s) are due for refill today: Yes  Requested medication (s) are on the active medication list: Yes  Last refill:  11/07/18  Future visit scheduled: No. Left message to call back and schedule an appointment.  Notes to clinic:  Prescription has expired. Pharmacy asking for diagnosis code.    Requested Prescriptions  Pending Prescriptions Disp Refills   losartan (COZAAR) 25 MG tablet [Pharmacy Med Name: LOSARTAN POTASSIUM 25 MG TAB] 90 tablet 3    Sig: TAKE 1 TABLET BY MOUTH EVERY DAY      Cardiovascular:  Angiotensin Receptor Blockers Failed - 05/22/2020 10:51 AM      Failed - Cr in normal range and within 180 days    Creat  Date Value Ref Range Status  11/05/2018 0.78 0.70 - 1.25 mg/dL Final    Comment:    For patients >19 years of age, the reference limit for Creatinine is approximately 13% higher for people identified as African-American. .           Failed - K in normal range and within 180 days    Potassium  Date Value Ref Range Status  11/05/2018 4.0 3.5 - 5.3 mmol/L Final          Failed - Last BP in normal range    BP Readings from Last 1 Encounters:  11/07/18 (!) 144/84          Failed - Valid encounter within last 6 months    Recent Outpatient Visits           1 year ago Annual physical exam   [facility] [place], Netta Neat, DO   2 years ago Annual physical exam   [facility] [place], Netta Neat, DO   3 years ago Compensated cirrhosis related to hepatitis C virus (HCV) [facility])   [facility] Smitty Cords, DO   3 years ago Hypertension, portal [facility])   [facility] Janeann Forehand., MD   3 years ago Hypertension, portal [facility])   [facility] Janeann Forehand., MD              Passed - Patient is not pregnant

## 2020-05-26 ENCOUNTER — Other Ambulatory Visit: Payer: Self-pay

## 2020-05-26 ENCOUNTER — Ambulatory Visit (INDEPENDENT_AMBULATORY_CARE_PROVIDER_SITE_OTHER): Payer: 59 | Admitting: Family Medicine

## 2020-05-26 ENCOUNTER — Encounter: Payer: Self-pay | Admitting: Family Medicine

## 2020-05-26 VITALS — BP 144/72 | HR 50 | Temp 98.0°F | Resp 16 | Ht 71.0 in | Wt 195.6 lb

## 2020-05-26 DIAGNOSIS — K7460 Unspecified cirrhosis of liver: Secondary | ICD-10-CM

## 2020-05-26 DIAGNOSIS — R7303 Prediabetes: Secondary | ICD-10-CM

## 2020-05-26 DIAGNOSIS — I8510 Secondary esophageal varices without bleeding: Secondary | ICD-10-CM

## 2020-05-26 DIAGNOSIS — B1920 Unspecified viral hepatitis C without hepatic coma: Secondary | ICD-10-CM

## 2020-05-26 DIAGNOSIS — K766 Portal hypertension: Secondary | ICD-10-CM | POA: Diagnosis not present

## 2020-05-26 DIAGNOSIS — H9191 Unspecified hearing loss, right ear: Secondary | ICD-10-CM | POA: Diagnosis not present

## 2020-05-26 DIAGNOSIS — K7469 Other cirrhosis of liver: Secondary | ICD-10-CM

## 2020-05-26 MED ORDER — NADOLOL 20 MG PO TABS: 10.0000 mg | ORAL_TABLET | Freq: Every evening | ORAL | 3 refills | Status: DC

## 2020-05-26 NOTE — Patient Instructions (Addendum)
Thank you for coming to the office today.  Updated history of colonoscopy 02/09/15 - [facility] - no polyps, next advised repeat colonoscopy in 10 years, 2026.  Ask GI next time about that if you are curious and wanted to follow-up.  Re ordered Nadolol for 1 year. Should have 1 year on Losartan as well.  DUE for FASTING BLOOD WORK (no food or drink after midnight before the lab appointment, only water or coffee without cream/sugar on the morning of)  SCHEDULE "Lab Only" visit in the morning at the clinic for lab draw in 6 MONTHS   - Make sure Lab Only appointment is at about 1 week before your next appointment, so that results will be available  For Lab Results, once available within 2-3 days of blood draw, you can can log in to MyChart online to view your results and a brief explanation. Also, we can discuss results at next follow-up visit.    Please schedule a Follow-up Appointment to: Return in about 6 months (around 11/25/2020) for Annual Physical.  If you have any other questions or concerns, please feel free to call the office or send a message through MyChart. You may also schedule an earlier appointment if necessary.  Additionally, you may be receiving a survey about your experience at our office within a few days to 1 week by e-mail or mail. We value your feedback.  Saralyn Pilar, DO [facility], [place]

## 2020-05-26 NOTE — Progress Notes (Signed)
Subjective:    Patient ID: ALP GOLDWATER, male    DOB: 11-Apr-1956, 64 y.o.   MRN: [id number]  Jesus Mejia is a 64 y.o. male presenting on 05/26/2020 for Hypertension (going on vacation tomorrow needed a refill of his medication)  Accompanied by spouse, Aarin Bluett, who provides additional history.  HPI  Specialist: GI Hepatology - Dr Deniece Ree ([facility])  Portal Hypertension Home BP readings avg 140-80 Current Meds -Losartan 25mg  daily, Nadolol 20mg  (half tablet evening) needs refills Reports good compliance, took meds today. Tolerating well, w/o complaints  Pre-Diabetes Prior history years ago with PreDM, last several readings have been normal, last reading A1c 5.1 in 2019 CBGs:Not checking Meds:None Lifestyle: - Diet (still follows improved diet, reduced carb sugar)  - Exercise (Active outside regularly, walking)  Hepatic Cirrhosis due to Chronic Hep C (treated s/p Harvoni) / Complicated by Esophageal Varices, Portal HTN - Background history with prior history of chronic Hep C infection diagnosed in 2012 with complicated cirrhosis, he was treated with Harvoni by 2015 with complete resolution of HCV infection by report and chart review - Followed by [facility] 2016.. They will do surveillance abdominal [facility] - He does have complications from cirrhosis with Esophageal Varices and Portal HTN - He continues on Nadolol BB for varices and HR in past had tachycardia  PMH - Deafness Right Ear  Health Maintenance:  - UTD Colonoscopy - followed by [facility]. - UTD TDap  Prostate CA Screening: Prior PSA / DRE reported normal. Last PSA0.3(10/2017). Currently asymptomatic.No known family history of prostate CA. Did not have PSA screening last 1-2 years, now due for labs.   Health Maintenance: Not up to date on COVID19 Vaccine  Depression screen [facility] 2/9 05/26/2020 11/07/2018 11/02/2017  Decreased Interest 0 0 0  Down, Depressed,  Hopeless 0 0 0  PHQ - 2 Score 0 0 0    Social History   Tobacco Use   Smoking status: Former Smoker    Packs/day: 1.00    Years: 20.00    Pack years: 20.00    Types: Cigarettes    Quit date: 09/08/1993    Years since quitting: 26.7   Smokeless tobacco: Former 13/07/2017  Substance Use Topics   Alcohol use: No    Alcohol/week: 0.0 standard drinks    Comment: former quit 14 years ago   Drug use: No    Comment: former quit 15 years ago    Review of Systems Per HPI unless specifically indicated above     Objective:    BP (!) 144/72    Pulse (!) 50    Temp 98 F (36.7 C) (Temporal)    Resp 16    Ht 5\' 11"  (1.803 m)    Wt 195 lb 9.6 oz (88.7 kg)    SpO2 98%    BMI 27.28 kg/m   Wt Readings from Last 3 Encounters:  05/26/20 195 lb 9.6 oz (88.7 kg)  11/07/18 190 lb (86.2 kg)  11/02/17 193 lb (87.5 kg)    Physical Exam Vitals and nursing note reviewed.  Constitutional:      General: He is not in acute distress.    Appearance: He is well-developed. He is not diaphoretic.     Comments: Well-appearing, comfortable, cooperative  HENT:     Head: Normocephalic and atraumatic.  Eyes:     General:        Right eye: No discharge.        Left eye: No  discharge.     Conjunctiva/sclera: Conjunctivae normal.     Pupils: Pupils are equal, round, and reactive to light.  Neck:     Thyroid: No thyromegaly.  Cardiovascular:     Rate and Rhythm: Normal rate and regular rhythm.     Heart sounds: Normal heart sounds. No murmur.  Pulmonary:     Effort: Pulmonary effort is normal. No respiratory distress.     Breath sounds: Normal breath sounds. No wheezing or rales.  Abdominal:     General: Bowel sounds are normal. There is no distension.     Palpations: Abdomen is soft. There is no mass.     Tenderness: There is no abdominal tenderness.  Musculoskeletal:        General: No tenderness. Normal range of motion.     Cervical back: Normal range of motion and neck supple.     Comments:  Upper / Lower Extremities: - Normal muscle tone, strength bilateral upper extremities 5/5, lower extremities 5/5  Lymphadenopathy:     Cervical: No cervical adenopathy.  Skin:    General: Skin is warm and dry.     Findings: No erythema or rash.  Neurological:     Mental Status: He is alert and oriented to person, place, and time.     Comments: Distal sensation intact to light touch all extremities  Psychiatric:        Behavior: Behavior normal.     Comments: Well groomed, good eye contact, normal speech and thoughts        Results for orders placed or performed in visit on 05/26/20  HM COLONOSCOPY  Result Value Ref Range   HM Colonoscopy See Report (in chart) See Report (in chart), Patient Reported      Assessment & Plan:   Problem List Items Addressed This Visit    Prediabetes    Will check A1c next time with lab      Portal hypertension ([place]) - Primary    Mild elevated BP persistently, asymptomatic Otherwise stable, without complication Continues on beta blocker Nadolol Followed by [facility] for surveillance  Plan: 1. Re order Losartan 25mg  daily - new rx sent. Discussed may consider double dose to 25mg  x 2 = 50mg  daily in future if indicated, monitor BP closely at home. - Refill Nadolol, taking half dose daily      Relevant Medications   nadolol (CORGARD) 20 MG tablet   Esophageal varices in cirrhosis (HCC)    Stable, without complication. Continues on beta blocker Nadolol. Followed by [facility] for surveillance      Relevant Medications   nadolol (CORGARD) 20 MG tablet   Deafness in right ear   Compensated cirrhosis related to hepatitis C virus (HCV) (HCC)    Stable, without recent changes, remains compensated. Complicated by esophageal varices and portal HTN. Continues on beta blocker Nadolol and ARB low dose. Followed by [facility] for surveillance         Meds ordered this encounter  Medications   nadolol (CORGARD) 20 MG tablet    Sig: Take 0.5 tablets  (10 mg total) by mouth every evening.    Dispense:  45 tablet    Refill:  3    Please dispense 90 day supply      Follow up plan: Return in about 6 months (around 11/25/2020) for Annual Physical.  Future labs ordered for 11/26/20 TSH PSA, A1c, Lipidl  Other labs to be drawn by [place]  [facility] 05/26/2020, 11:41 AM

## 2020-05-27 ENCOUNTER — Other Ambulatory Visit: Payer: Self-pay | Admitting: Family Medicine

## 2020-05-27 DIAGNOSIS — Z125 Encounter for screening for malignant neoplasm of prostate: Secondary | ICD-10-CM

## 2020-05-27 DIAGNOSIS — B1920 Unspecified viral hepatitis C without hepatic coma: Secondary | ICD-10-CM

## 2020-05-27 DIAGNOSIS — K766 Portal hypertension: Secondary | ICD-10-CM

## 2020-05-27 DIAGNOSIS — Z0000 Encounter for general adult medical examination without abnormal findings: Secondary | ICD-10-CM

## 2020-05-27 DIAGNOSIS — R7303 Prediabetes: Secondary | ICD-10-CM

## 2020-05-27 DIAGNOSIS — E782 Mixed hyperlipidemia: Secondary | ICD-10-CM

## 2020-05-27 NOTE — Assessment & Plan Note (Signed)
Stable, without complication. Continues on beta blocker Nadolol. Followed by [facility] for surveillance 

## 2020-05-27 NOTE — Assessment & Plan Note (Signed)
Will check A1c next time with lab

## 2020-05-27 NOTE — Assessment & Plan Note (Signed)
Stable, without recent changes, remains compensated. Complicated by esophageal varices and portal HTN. Continues on beta blocker Nadolol and ARB low dose. Followed by [facility] for surveillance

## 2020-05-27 NOTE — Assessment & Plan Note (Signed)
Mild elevated BP persistently, asymptomatic Otherwise stable, without complication Continues on beta blocker Nadolol Followed by [facility] for surveillance  Plan: 1. Re order Losartan 25mg  daily - new rx sent. Discussed may consider double dose to 25mg  x 2 = 50mg  daily in future if indicated, monitor BP closely at home. - Refill Nadolol, taking half dose daily

## 2020-10-14 ENCOUNTER — Ambulatory Visit (INDEPENDENT_AMBULATORY_CARE_PROVIDER_SITE_OTHER): Payer: 59

## 2020-10-14 ENCOUNTER — Encounter: Payer: Self-pay | Admitting: Podiatry

## 2020-10-14 ENCOUNTER — Ambulatory Visit (INDEPENDENT_AMBULATORY_CARE_PROVIDER_SITE_OTHER): Payer: 59 | Admitting: Podiatry

## 2020-10-14 ENCOUNTER — Other Ambulatory Visit: Payer: Self-pay

## 2020-10-14 DIAGNOSIS — M722 Plantar fascial fibromatosis: Secondary | ICD-10-CM

## 2020-10-14 MED ORDER — METHYLPREDNISOLONE 4 MG PO TBPK
ORAL_TABLET | ORAL | 0 refills | Status: DC
Start: 2020-10-14 — End: 2020-11-18

## 2020-10-14 NOTE — Patient Instructions (Signed)

## 2020-10-14 NOTE — Progress Notes (Signed)
  Subjective:  Patient ID: Jesus Mejia, male    DOB: December 21, 1956,  MRN: [id number]  Chief Complaint  Patient presents with  . Foot Pain    Patient presents today for right heel pain x 2 months    64 y.o. male presents with the above complaint. History confirmed with patient.  This is worse for him in the morning and when he goes from standing to sitting.  He said he feels a sore muscle.  Massage is helpful.  Objective:  Physical Exam: warm, good capillary refill, no trophic changes or ulcerative lesions, normal DP and PT pulses and normal sensory exam.  Right Foot: point tenderness over the heel pad   No images are attached to the encounter.  Radiographs: X-ray of the right foot: no fracture, dislocation, swelling or degenerative changes noted and plantar calcaneal spur Assessment:   1. Plantar fasciitis      Plan:  Patient was evaluated and treated and all questions answered.  Discussed the etiology and treatment options for plantar fasciitis including stretching, formal physical therapy, supportive shoegears such as a running shoe or sneaker, pre fabricated orthoses, injection therapy, and oral medications. We also discussed the role of surgical treatment of this for patients who do not improve after exhausting non-surgical treatment options.    -XR reviewed with patient -Educated patient on stretching and icing of the affected limb -Rx for medrol pack. Educated on use, risks, and benefits of the medication  After sterile prep with povidone-iodine solution and alcohol, the right heel was injected with 0.5cc 2% xylocaine plain, 0.5cc 0.5% marcaine plain, 5mg  triamcinolone acetonide, and 2mg  dexamethasone was injected along the plantar fascia at the insertion on the plantar calcaneus. The patient tolerated the procedure well without complication.   Return in about 1 month (around 11/14/2020) for recheck plantar fasciitis.

## 2020-11-18 ENCOUNTER — Other Ambulatory Visit: Payer: Self-pay

## 2020-11-18 ENCOUNTER — Encounter: Payer: Self-pay | Admitting: Podiatry

## 2020-11-18 ENCOUNTER — Ambulatory Visit (INDEPENDENT_AMBULATORY_CARE_PROVIDER_SITE_OTHER): Payer: 59 | Admitting: Podiatry

## 2020-11-18 DIAGNOSIS — M722 Plantar fascial fibromatosis: Secondary | ICD-10-CM

## 2020-11-18 NOTE — Progress Notes (Signed)
  Subjective:  Patient ID: KALIQ LEGE, male    DOB: [birth year],  MRN: [id number]  Chief Complaint  Patient presents with  . Plantar Fasciitis    "its doing alot better.  It gets sore if I have had a really busy day"    64 y.o. male returns with the above complaint. History confirmed with patient.  Doing much better, nearly 100%. Minimal pain. Doing stretches. Can be sore at end of day  Objective:  Physical Exam: warm, good capillary refill, no trophic changes or ulcerative lesions, normal DP and PT pulses and normal sensory exam.  Right Foot:no  point tenderness over the heel pad today    Assessment:   No diagnosis found.   Plan:  Patient was evaluated and treated and all questions answered.  Advised to continue stretching/icing the foot to continue to prevent this from recurring  At this point he will follow up on a PRN basis if it recurs. Advised to take NSAIDs PRN when it bothers him.   Return if symptoms worsen or fail to improve.

## 2020-11-26 ENCOUNTER — Other Ambulatory Visit: Payer: 59

## 2020-11-26 ENCOUNTER — Other Ambulatory Visit: Payer: Self-pay

## 2020-11-26 DIAGNOSIS — R7303 Prediabetes: Secondary | ICD-10-CM

## 2020-11-26 DIAGNOSIS — Z125 Encounter for screening for malignant neoplasm of prostate: Secondary | ICD-10-CM

## 2020-11-26 DIAGNOSIS — Z0000 Encounter for general adult medical examination without abnormal findings: Secondary | ICD-10-CM

## 2020-11-26 DIAGNOSIS — E782 Mixed hyperlipidemia: Secondary | ICD-10-CM

## 2020-11-27 LAB — PSA: PSA: 0.15 ng/mL (ref <=4.0)

## 2020-11-27 LAB — LIPID PANEL
Cholesterol: 154 mg/dL (ref <200)
HDL: 43 mg/dL (ref >=40)
LDL Cholesterol (Calc): 89 mg/dL (calc)
Non-HDL Cholesterol (Calc): 111 mg/dL (calc) (ref <130)
Total CHOL/HDL Ratio: 3.6 (calc) (ref <5.0)
Triglycerides: 128 mg/dL (ref <150)

## 2020-11-27 LAB — HEMOGLOBIN A1C
Hgb A1c MFr Bld: 5.9 % of total Hgb — ABNORMAL HIGH (ref <5.7)
Mean Plasma Glucose: 123 (calc)
eAG (mmol/L): 6.8 (calc)

## 2020-11-27 LAB — TSH: TSH: 3.13 mIU/L (ref 0.40–4.50)

## 2020-11-30 ENCOUNTER — Encounter: Payer: Self-pay | Admitting: Family Medicine

## 2020-11-30 ENCOUNTER — Ambulatory Visit (INDEPENDENT_AMBULATORY_CARE_PROVIDER_SITE_OTHER): Payer: 59 | Admitting: Family Medicine

## 2020-11-30 ENCOUNTER — Other Ambulatory Visit: Payer: Self-pay

## 2020-11-30 VITALS — BP 137/76 | HR 49 | Temp 97.5°F | Resp 16 | Ht 71.0 in | Wt 201.2 lb

## 2020-11-30 DIAGNOSIS — E782 Mixed hyperlipidemia: Secondary | ICD-10-CM

## 2020-11-30 DIAGNOSIS — R7303 Prediabetes: Secondary | ICD-10-CM | POA: Diagnosis not present

## 2020-11-30 DIAGNOSIS — I8510 Secondary esophageal varices without bleeding: Secondary | ICD-10-CM

## 2020-11-30 DIAGNOSIS — K7460 Unspecified cirrhosis of liver: Secondary | ICD-10-CM

## 2020-11-30 DIAGNOSIS — D696 Thrombocytopenia, unspecified: Secondary | ICD-10-CM | POA: Diagnosis not present

## 2020-11-30 DIAGNOSIS — K766 Portal hypertension: Secondary | ICD-10-CM

## 2020-11-30 DIAGNOSIS — Z0000 Encounter for general adult medical examination without abnormal findings: Secondary | ICD-10-CM

## 2020-11-30 DIAGNOSIS — B182 Chronic viral hepatitis C: Secondary | ICD-10-CM

## 2020-11-30 NOTE — Patient Instructions (Addendum)
Thank you for coming to the office today.  Recent Labs    11/26/20 0817  HGBA1C 5.9*   Try to limit carb starches sugars.  Rest of blood work is good  A1c fingerstick blood in 6 months.  Please schedule a Follow-up Appointment to: Return in about 6 months (around 05/31/2021) for 6 month follow-up PreDM A1c, liver updates.  If you have any other questions or concerns, please feel free to call the office or send a message through MyChart. You may also schedule an earlier appointment if necessary.  Additionally, you may be receiving a survey about your experience at our office within a few days to 1 week by e-mail or mail. We value your feedback.  Saralyn Pilar, DO [facility], [place]

## 2020-11-30 NOTE — Progress Notes (Signed)
Subjective:    Patient ID: Jesus Mejia, male    DOB: 01/05/1956, 64 y.o.   MRN: [id number]  Jesus Mejia is a 64 y.o. male presenting on 11/30/2020 for Annual Exam   HPI   Accompanied by spouse, Lovett Coffin, who provides additional history.  Here for Annual Physical and Lab Review.  Specialist: [facility]  Portal Hypertension Home BP readings avg 140-80 Current Meds -Losartan 25mg  daily, Nadolol 20mg  (half tablet evening) needs refills Reports good compliance, took meds today. Tolerating well, w/o complaints  Pre-Diabetes Prior history years ago with PreDM, last several readings have been normal Last result now increased with A1c 5.9, attributed to poor diet at times CBGs:Not checking Meds:None Lifestyle: - Diet (still follows improved diet, reduced carb sugar)  - Exercise (Active outsideregularly, walking)  Hepatic Cirrhosis due to Chronic Hep C (treated s/p Harvoni) / Complicated by Esophageal Varices, Portal HTN -Background history withprior history of chronic Hep C infection diagnosed in 2012 with complicated cirrhosis, he was treated with Harvoni by 2015 with complete resolution of HCV infection by report and chart review - Followed by [facility]Hepatology Dr 2016.. They will do surveillance abdominal [facility]. Last visit 08/2020 -He does have complications from cirrhosis with Esophageal Varices and Portal HTN - He continues on Nadolol BB for varices and HR in past had tachycardia  PMH -Deafness Right Ear  Additional update  R Plantar Fasciitis, chronic - seen by [facility], has been treated and has improvement.  Health Maintenance:  - UTD Colonoscopy- followed by [facility]. - UTD TDap  UTD COVID Vaccine Pfizer.  Prostate CA Screening: Prior PSA / DRE reported normal. Last PSA0.15(11/2020) previously 0.3. Currently asymptomatic.No known family history of prostate CA.Did not have PSA screening last 1-2  years, now due for labs.   Depression screen [facility] 2/9 11/30/2020 05/26/2020 11/07/2018  Decreased Interest 0 0 0  Down, Depressed, Hopeless 0 0 0  PHQ - 2 Score 0 0 0    Past Medical History:  Diagnosis Date  . Acute hepatitis C   . Bleeding nose   . Chronic fatigue   . Cirrhosis, non-alcoholic (HCC)   . Elevated serum glucose    Past Surgical History:  Procedure Laterality Date  . HERNIA REPAIR     Social History   Socioeconomic History  . Marital status: Married    Spouse name: Not on file  . Number of children: Not on file  . Years of education: Not on file  . Highest education level: Not on file  Occupational History  . Not on file  Tobacco Use  . Smoking status: Former Smoker    Packs/day: 1.00    Years: 20.00    Pack years: 20.00    Types: Cigarettes    Quit date: 09/08/1993    Years since quitting: 27.2  . Smokeless tobacco: Former 11/09/2018 and Sexual Activity  . Alcohol use: No    Alcohol/week: 0.0 standard drinks    Comment: former quit 14 years ago  . Drug use: No    Comment: former quit 15 years ago  . Sexual activity: Not on file  Other Topics Concern  . Not on file  Social History Narrative  . Not on file   Social Determinants of Health   Financial Resource Strain:   . Difficulty of Paying Living Expenses: Not on file  Food Insecurity:   . Worried About 09/10/1993 in the Last Year: Not on  file  . Ran Out of Food in the Last Year: Not on file  Transportation Needs:   . Lack of Transportation (Medical): Not on file  . Lack of Transportation (Non-Medical): Not on file  Physical Activity:   . Days of Exercise per Week: Not on file  . Minutes of Exercise per Session: Not on file  Stress:   . Feeling of Stress : Not on file  Social Connections:   . Frequency of Communication with Friends and Family: Not on file  . Frequency of Social Gatherings with Friends and Family: Not on file  . Attends Religious Services: Not on file  .  Active Member of Clubs or Organizations: Not on file  . Attends BankerClub or Organization Meetings: Not on file  . Marital Status: Not on file  Intimate Partner Violence:   . Fear of Current or Ex-Partner: Not on file  . Emotionally Abused: Not on file  . Physically Abused: Not on file  . Sexually Abused: Not on file   Family History  Problem Relation Age of Onset  . Cancer Mother        breast  . Cancer Sister        breast  . Hypertension Brother   . Prostate cancer Neg Hx    Current Outpatient Medications on File Prior to Visit  Medication Sig  . calcium & magnesium carbonates (MYLANTA) 311-232 MG per tablet Take 1 tablet by mouth daily as needed.   Marland Kitchen. losartan (COZAAR) 25 MG tablet TAKE 1 TABLET BY MOUTH EVERY DAY  . Magnesium 250 MG TABS Take by mouth. As needed  . Methylsulfonylmethane 1000 MG CAPS Take 1 capsule by mouth daily as needed.  . Multiple Vitamin (MULTIVITAMIN WITH MINERALS) TABS tablet Take 1 tablet by mouth daily.  . nadolol (CORGARD) 20 MG tablet Take 0.5 tablets (10 mg total) by mouth every evening.   No current facility-administered medications on file prior to visit.    Review of Systems  Constitutional: Negative for activity change, appetite change, chills, diaphoresis, fatigue and fever.  HENT: Negative for congestion and hearing loss.   Eyes: Negative for visual disturbance.  Respiratory: Negative for cough, chest tightness, shortness of breath and wheezing.   Cardiovascular: Negative for chest pain, palpitations and leg swelling.  Gastrointestinal: Negative for abdominal pain, constipation, diarrhea, nausea and vomiting.  Genitourinary: Negative for dysuria, frequency and hematuria.  Musculoskeletal: Negative for arthralgias and neck pain.  Skin: Negative for rash.  Allergic/Immunologic: Negative for environmental allergies.  Neurological: Negative for dizziness, weakness, light-headedness, numbness and headaches.  Hematological: Negative for  adenopathy.  Psychiatric/Behavioral: Negative for behavioral problems, dysphoric mood and sleep disturbance.   Per HPI unless specifically indicated above     Objective:    BP 137/76   Pulse (!) 49   Temp (!) 97.5 F (36.4 C) (Temporal)   Resp 16   Ht 5\' 11"  (1.803 m)   Wt 201 lb 3.2 oz (91.3 kg)   SpO2 98%   BMI 28.06 kg/m   Wt Readings from Last 3 Encounters:  11/30/20 201 lb 3.2 oz (91.3 kg)  05/26/20 195 lb 9.6 oz (88.7 kg)  11/07/18 190 lb (86.2 kg)    Physical Exam Vitals and nursing note reviewed.  Constitutional:      General: He is not in acute distress.    Appearance: He is well-developed. He is not diaphoretic.     Comments: Well-appearing, comfortable, cooperative  HENT:     Head: Normocephalic and  atraumatic.     Right Ear: There is impacted cerumen.     Left Ear: Tympanic membrane, ear canal and external ear normal.     Ears:     Comments: Hearing reduced R ear Eyes:     General:        Right eye: No discharge.        Left eye: No discharge.     Conjunctiva/sclera: Conjunctivae normal.     Pupils: Pupils are equal, round, and reactive to light.  Neck:     Thyroid: No thyromegaly.  Cardiovascular:     Rate and Rhythm: Normal rate and regular rhythm.     Heart sounds: Normal heart sounds. No murmur heard.   Pulmonary:     Effort: Pulmonary effort is normal. No respiratory distress.     Breath sounds: Normal breath sounds. No wheezing or rales.  Abdominal:     General: Bowel sounds are normal. There is no distension.     Palpations: Abdomen is soft. There is no mass.     Tenderness: There is no abdominal tenderness.  Musculoskeletal:        General: No tenderness. Normal range of motion.     Cervical back: Normal range of motion and neck supple.     Right lower leg: No edema.     Left lower leg: No edema.     Comments: Upper / Lower Extremities: - Normal muscle tone, strength bilateral upper extremities 5/5, lower extremities 5/5    Lymphadenopathy:     Cervical: No cervical adenopathy.  Skin:    General: Skin is warm and dry.     Findings: No erythema or rash.  Neurological:     Mental Status: He is alert and oriented to person, place, and time.     Comments: Distal sensation intact to light touch all extremities  Psychiatric:        Behavior: Behavior normal.     Comments: Well groomed, good eye contact, normal speech and thoughts       Results for orders placed or performed in visit on 11/26/20  TSH  Result Value Ref Range   TSH 3.13 0.40 - 4.50 mIU/L  PSA  Result Value Ref Range   PSA 0.15 < OR = 4.0 ng/mL  Lipid panel  Result Value Ref Range   Cholesterol 154 <200 mg/dL   HDL 43 > OR = 40 mg/dL   Triglycerides 010 <272 mg/dL   LDL Cholesterol (Calc) 89 mg/dL (calc)   Total CHOL/HDL Ratio 3.6 <5.0 (calc)   Non-HDL Cholesterol (Calc) 111 <130 mg/dL (calc)  Hemoglobin Z3G  Result Value Ref Range   Hgb A1c MFr Bld 5.9 (H) <5.7 % of total Hgb   Mean Plasma Glucose 123 (calc)   eAG (mmol/L) 6.8 (calc)      Assessment & Plan:   Problem List Items Addressed This Visit    Thrombocytopenia (HCC)    Secondary to cirrhosis Stable mild low plt Followed by hepatology      Prediabetes    Elevated A1c to 5.9 At risk of progression PreDM Encourage lifestyle modification aggressive low carb      Portal hypertension (HCC)    Mild elevated BP persistently, asymptomatic Otherwise stable, without complication Continues on beta blocker Nadolol Followed by [facility] for surveillance  Plan: - Continue current course Losartan 25mg  - Refill Nadolol, taking half dose daily      Hyperlipidemia    Controlled cholesterol on lifestyle Last lipid panel 11/2020 The  10-year ASCVD risk score Denman George DC Jr., et al., 2013) is: 10.1%   Plan: 1. Defer statin, declined and patient has cirrhosis 2. Encourage improved lifestyle - low carb/cholesterol, reduce portion size, continue improving regular exercise       Hepatic cirrhosis due to chronic hepatitis C infection (HCC)   Esophageal varices in cirrhosis (HCC)    Stable, without complication. Continues on beta blocker Nadolol. Followed by [facility] for surveillance       Other Visit Diagnoses    Annual physical exam    -  Primary      Updated Health Maintenance information - UTD COVID Vaccine Pfizer Reviewed recent lab results with patient Encouraged improvement to lifestyle with diet and exercise - Goal of weight loss    No orders of the defined types were placed in this encounter.     Follow up plan: Return in about 6 months (around 05/31/2021) for 6 month follow-up PreDM A1c, liver updates.   Saralyn Pilar, DO [facility] 11/30/2020, 8:45 AM

## 2020-12-01 NOTE — Assessment & Plan Note (Signed)
Controlled cholesterol on lifestyle Last lipid panel 11/2020 The 10-year ASCVD risk score Denman George DC Jr., et al., 2013) is: 10.1%   Plan: 1. Defer statin, declined and patient has cirrhosis 2. Encourage improved lifestyle - low carb/cholesterol, reduce portion size, continue improving regular exercise

## 2020-12-01 NOTE — Assessment & Plan Note (Signed)
Mild elevated BP persistently, asymptomatic Otherwise stable, without complication Continues on beta blocker Nadolol Followed by [facility] for surveillance  Plan: - Continue current course Losartan 25mg  - Refill Nadolol, taking half dose daily

## 2020-12-01 NOTE — Assessment & Plan Note (Signed)
Elevated A1c to 5.9 At risk of progression PreDM Encourage lifestyle modification aggressive low carb

## 2020-12-01 NOTE — Assessment & Plan Note (Signed)
Stable, without complication. Continues on beta blocker Nadolol. Followed by [facility] for surveillance 

## 2020-12-01 NOTE — Assessment & Plan Note (Signed)
Secondary to cirrhosis Stable mild low plt Followed by hepatology 

## 2021-04-28 LAB — HM DIABETES EYE EXAM

## 2021-05-26 ENCOUNTER — Other Ambulatory Visit: Payer: Self-pay | Admitting: Family Medicine

## 2021-05-26 DIAGNOSIS — K766 Portal hypertension: Secondary | ICD-10-CM

## 2021-05-31 ENCOUNTER — Encounter: Payer: Self-pay | Admitting: Family Medicine

## 2021-05-31 ENCOUNTER — Other Ambulatory Visit: Payer: Self-pay | Admitting: Family Medicine

## 2021-05-31 ENCOUNTER — Ambulatory Visit (INDEPENDENT_AMBULATORY_CARE_PROVIDER_SITE_OTHER): Payer: 59 | Admitting: Family Medicine

## 2021-05-31 ENCOUNTER — Other Ambulatory Visit: Payer: Self-pay

## 2021-05-31 VITALS — BP 128/68 | HR 42 | Ht 71.0 in | Wt 176.8 lb

## 2021-05-31 DIAGNOSIS — K7460 Unspecified cirrhosis of liver: Secondary | ICD-10-CM

## 2021-05-31 DIAGNOSIS — B182 Chronic viral hepatitis C: Secondary | ICD-10-CM

## 2021-05-31 DIAGNOSIS — K766 Portal hypertension: Secondary | ICD-10-CM | POA: Diagnosis not present

## 2021-05-31 DIAGNOSIS — I8510 Secondary esophageal varices without bleeding: Secondary | ICD-10-CM | POA: Diagnosis not present

## 2021-05-31 DIAGNOSIS — E782 Mixed hyperlipidemia: Secondary | ICD-10-CM

## 2021-05-31 DIAGNOSIS — R7303 Prediabetes: Secondary | ICD-10-CM | POA: Diagnosis not present

## 2021-05-31 DIAGNOSIS — D696 Thrombocytopenia, unspecified: Secondary | ICD-10-CM

## 2021-05-31 DIAGNOSIS — Z125 Encounter for screening for malignant neoplasm of prostate: Secondary | ICD-10-CM

## 2021-05-31 DIAGNOSIS — Z0000 Encounter for general adult medical examination without abnormal findings: Secondary | ICD-10-CM

## 2021-05-31 LAB — POCT GLYCOSYLATED HEMOGLOBIN (HGB A1C): Hemoglobin A1C: 5 % (ref 4.0–5.6)

## 2021-05-31 MED ORDER — NADOLOL 20 MG PO TABS: 10.0000 mg | ORAL_TABLET | Freq: Every evening | ORAL | 3 refills | Status: DC

## 2021-05-31 MED ORDER — LOSARTAN POTASSIUM 25 MG PO TABS: 25.0000 mg | ORAL_TABLET | Freq: Every day | ORAL | 3 refills | Status: DC

## 2021-05-31 NOTE — Patient Instructions (Addendum)
Thank you for coming to the office today.  Refilled medications today 90 day with 3 refills  Recent Labs    11/26/20 0817 05/31/21 0819  HGBA1C 5.9* 5.0     DUE for FASTING BLOOD WORK (no food or drink after midnight before the lab appointment, only water or coffee without cream/sugar on the morning of)  SCHEDULE "Lab Only" visit in the morning at the clinic for lab draw in 6 MONTHS   - Make sure Lab Only appointment is at about 1 week before your next appointment, so that results will be available  For Lab Results, once available within 2-3 days of blood draw, you can can log in to MyChart online to view your results and a brief explanation. Also, we can discuss results at next follow-up visit.   Please schedule a Follow-up Appointment to: Return in about 6 months (around 11/30/2021) for 6 month fasting lab only then 1 week later Annual Physical.  If you have any other questions or concerns, please feel free to call the office or send a message through MyChart. You may also schedule an earlier appointment if necessary.  Additionally, you may be receiving a survey about your experience at our office within a few days to 1 week by e-mail or mail. We value your feedback.  Saralyn Pilar, DO [facility] [place], [place]

## 2021-05-31 NOTE — Progress Notes (Signed)
Subjective:    Patient ID: Jesus Mejia, male    DOB: 16-Dec-1956, 65 y.o.   MRN: [id number]  Jesus Mejia is a 65 y.o. male presenting on 05/31/2021 for Prediabetes   HPI   Pre-Diabetes Prior history years ago with PreDM, last several readings have been normal Last result now increased with A1c 5.9, attributed to poor diet at times CBGs:Not checking Meds:None Lifestyle: - Diet (still follows improved diet, reduced carb sugar)  - Exercise (Active outsideregularly, walking)  Portal Hypertension PMH Hepatic Cirrhosis due to Chronic Hep C, Esophageal Varices, Portal HTN Followed by [facility] On Nadolol, Losartan needs refills. History of varices and tachycardia    Depression screen [facility] 2/9 05/31/2021 11/30/2020 05/26/2020  Decreased Interest 0 0 0  Down, Depressed, Hopeless 0 0 0  PHQ - 2 Score 0 0 0  Altered sleeping 0 - -  Tired, decreased energy 0 - -  Change in appetite 0 - -  Feeling bad or failure about yourself  0 - -  Trouble concentrating 0 - -  Moving slowly or fidgety/restless 0 - -  Suicidal thoughts 0 - -  PHQ-9 Score 0 - -  Difficult doing work/chores Not difficult at all - -    Social History   Tobacco Use  . Smoking status: Former Smoker    Packs/day: 1.00    Years: 20.00    Pack years: 20.00    Types: Cigarettes    Quit date: 09/08/1993    Years since quitting: 27.7  . Smokeless tobacco: Former Engineer, water Use Topics  . Alcohol use: No    Alcohol/week: 0.0 standard drinks    Comment: former quit 14 years ago  . Drug use: No    Comment: former quit 15 years ago    Review of Systems Per HPI unless specifically indicated above     Objective:    BP 128/68   Pulse (!) 42   Ht 5\' 11"  (1.803 m)   Wt 176 lb 12.8 oz (80.2 kg)   BMI 24.66 kg/m   Wt Readings from Last 3 Encounters:  05/31/21 176 lb 12.8 oz (80.2 kg)  11/30/20 201 lb 3.2 oz (91.3 kg)  05/26/20 195 lb 9.6 oz (88.7 kg)    Physical Exam Vitals and  nursing note reviewed.  Constitutional:      General: He is not in acute distress.    Appearance: He is well-developed. He is not diaphoretic.     Comments: Well-appearing, comfortable, cooperative  HENT:     Head: Normocephalic and atraumatic.  Eyes:     General:        Right eye: No discharge.        Left eye: No discharge.     Conjunctiva/sclera: Conjunctivae normal.  Cardiovascular:     Rate and Rhythm: Normal rate.  Pulmonary:     Effort: Pulmonary effort is normal.  Skin:    General: Skin is warm and dry.     Findings: No erythema or rash.  Neurological:     Mental Status: He is alert and oriented to person, place, and time.  Psychiatric:        Behavior: Behavior normal.     Comments: Well groomed, good eye contact, normal speech and thoughts       Results for orders placed or performed in visit on 05/31/21  POCT HgB A1C  Result Value Ref Range   Hemoglobin A1C 5.0 4.0 - 5.6 %  Assessment & Plan:   Problem List Items Addressed This Visit    Prediabetes - Primary    Significant improved A1c from 5.9 down to 5.0 Back to baseline non-prediabetic Improved on low carb diet Continue to improve lifestyle maintain A1c Check yearly now, after next lab 11/2021      Relevant Orders   POCT HgB A1C (Completed)   Portal hypertension (HCC)    Normal BP, controlled, asymptomatic Otherwise stable, without complication Continues on beta blocker Nadolol Followed by [facility] for surveillance  Plan: - Continue current course Losartan 25mg , Nadolol half tab Refill meds      Relevant Medications   losartan (COZAAR) 25 MG tablet   nadolol (CORGARD) 20 MG tablet   Esophageal varices in cirrhosis (HCC)   Relevant Medications   losartan (COZAAR) 25 MG tablet   nadolol (CORGARD) 20 MG tablet      Meds ordered this encounter  Medications  . losartan (COZAAR) 25 MG tablet    Sig: Take 1 tablet (25 mg total) by mouth daily.    Dispense:  90 tablet    Refill:  3     DX Code Needed K76.6  . nadolol (CORGARD) 20 MG tablet    Sig: Take 0.5 tablets (10 mg total) by mouth every evening.    Dispense:  45 tablet    Refill:  3      Follow up plan: Return in about 6 months (around 11/30/2021) for 6 month fasting lab only then 1 week later Annual Physical.  Future labs ordered for A1c, PSA, TSH Lipid - 11/30/21   14/6/22, DO [facility] 05/31/2021, 8:19 AM

## 2021-05-31 NOTE — Assessment & Plan Note (Signed)
Significant improved A1c from 5.9 down to 5.0 Back to baseline non-prediabetic Improved on low carb diet Continue to improve lifestyle maintain A1c Check yearly now, after next lab 11/2021

## 2021-05-31 NOTE — Assessment & Plan Note (Signed)
Normal BP, controlled, asymptomatic Otherwise stable, without complication Continues on beta blocker Nadolol Followed by [facility] for surveillance  Plan: - Continue current course Losartan 25mg , Nadolol half tab Refill meds

## 2021-06-16 ENCOUNTER — Ambulatory Visit: Payer: Self-pay

## 2021-06-16 NOTE — Telephone Encounter (Signed)
Pt. Reports he has pulled to ticks off in 2 weeks. He has a rash to chest,back and legs. Yesterday had a bullseye to chest - gone today. Has fatigue and headache today. Pt. Is on vacation and will go to ED.

## 2021-06-16 NOTE — Telephone Encounter (Signed)
Reason for Disposition  [1] 2 to 14 days following tick bite AND [2] widespread rash or headache AND [3] no fever  Answer Assessment - Initial Assessment Questions 1. TYPE of TICK: "Is it a wood tick or a deer tick?" (e.g., deer tick, wood tick; unsure)     Unsure 2. SIZE of TICK: "How big is the tick?" (e.g., size of poppy seed, apple seed, watermelon seed; unsure) Note: Deer ticks can be the size of a poppy seed (nymph) or an apple seed (adult).       One small and one big 3. ENGORGED: "Did the tick look flat or engorged (full, swollen)?" (e.g., flat, engorged; unsure)     2 weeks ago - yes   Another one 4 days 4. LOCATION: "Where is the tick bite located?"      One on back and one on stomach 5. ONSET: "How long do you think the tick was attached before you removed it?" (e.g., 5 hours, 2 days)      Unsure 6. APPEARANCE of BITE or RASH: "What does the site look like?"     Rash - on back and chest. Red 7. PREGNANCY: "Is there any chance you are pregnant?" "When was your last menstrual period?"     N/a  Protocols used: Tick Bite-A-AH

## 2021-11-26 ENCOUNTER — Other Ambulatory Visit: Payer: Self-pay

## 2021-11-26 DIAGNOSIS — Z0000 Encounter for general adult medical examination without abnormal findings: Secondary | ICD-10-CM

## 2021-11-26 DIAGNOSIS — R7303 Prediabetes: Secondary | ICD-10-CM

## 2021-11-26 DIAGNOSIS — Z125 Encounter for screening for malignant neoplasm of prostate: Secondary | ICD-10-CM

## 2021-11-26 DIAGNOSIS — E782 Mixed hyperlipidemia: Secondary | ICD-10-CM

## 2021-11-29 ENCOUNTER — Other Ambulatory Visit: Payer: Self-pay

## 2021-11-29 DIAGNOSIS — Z1329 Encounter for screening for other suspected endocrine disorder: Secondary | ICD-10-CM

## 2021-11-29 DIAGNOSIS — E782 Mixed hyperlipidemia: Secondary | ICD-10-CM

## 2021-11-29 DIAGNOSIS — Z125 Encounter for screening for malignant neoplasm of prostate: Secondary | ICD-10-CM

## 2021-11-29 DIAGNOSIS — R7303 Prediabetes: Secondary | ICD-10-CM

## 2021-11-29 DIAGNOSIS — Z0000 Encounter for general adult medical examination without abnormal findings: Secondary | ICD-10-CM

## 2021-11-30 ENCOUNTER — Other Ambulatory Visit: Payer: 59

## 2021-12-01 LAB — LIPID PANEL
Cholesterol: 139 mg/dL (ref <200)
HDL: 41 mg/dL (ref >=40)
LDL Cholesterol (Calc): 75 mg/dL (calc)
Non-HDL Cholesterol (Calc): 98 mg/dL (calc) (ref <130)
Total CHOL/HDL Ratio: 3.4 (calc) (ref <5.0)
Triglycerides: 156 mg/dL — ABNORMAL HIGH (ref <150)

## 2021-12-01 LAB — PSA: PSA: 0.18 ng/mL (ref <=4.00)

## 2021-12-01 LAB — HEMOGLOBIN A1C
Hgb A1c MFr Bld: 5.5 % of total Hgb (ref <5.7)
Mean Plasma Glucose: 111 mg/dL
eAG (mmol/L): 6.2 mmol/L

## 2021-12-01 LAB — TSH: TSH: 4.76 mIU/L — ABNORMAL HIGH (ref 0.40–4.50)

## 2021-12-07 ENCOUNTER — Ambulatory Visit (INDEPENDENT_AMBULATORY_CARE_PROVIDER_SITE_OTHER): Payer: 59 | Admitting: Family Medicine

## 2021-12-07 ENCOUNTER — Other Ambulatory Visit: Payer: Self-pay

## 2021-12-07 ENCOUNTER — Other Ambulatory Visit: Payer: Self-pay | Admitting: Family Medicine

## 2021-12-07 VITALS — BP 144/80 | HR 45 | Ht 71.0 in | Wt 191.6 lb

## 2021-12-07 DIAGNOSIS — Z0000 Encounter for general adult medical examination without abnormal findings: Secondary | ICD-10-CM

## 2021-12-07 DIAGNOSIS — K7460 Unspecified cirrhosis of liver: Secondary | ICD-10-CM

## 2021-12-07 DIAGNOSIS — I8510 Secondary esophageal varices without bleeding: Secondary | ICD-10-CM

## 2021-12-07 DIAGNOSIS — E782 Mixed hyperlipidemia: Secondary | ICD-10-CM

## 2021-12-07 DIAGNOSIS — R7303 Prediabetes: Secondary | ICD-10-CM | POA: Diagnosis not present

## 2021-12-07 DIAGNOSIS — D696 Thrombocytopenia, unspecified: Secondary | ICD-10-CM

## 2021-12-07 DIAGNOSIS — R7989 Other specified abnormal findings of blood chemistry: Secondary | ICD-10-CM

## 2021-12-07 DIAGNOSIS — K766 Portal hypertension: Secondary | ICD-10-CM

## 2021-12-07 NOTE — Assessment & Plan Note (Signed)
Controlled A1c 5.5 Improved on low carb diet Continue to improve lifestyle maintain A1c  Repeat 6 month

## 2021-12-07 NOTE — Assessment & Plan Note (Signed)
Secondary to cirrhosis Stable mild low plt Followed by hepatology 

## 2021-12-07 NOTE — Assessment & Plan Note (Signed)
Mild elevated BP, repeat improved Overall controlled, asymptomatic Otherwise stable, without complication Continues on beta blocker Nadolol Followed by [facility] for surveillance  Plan: - Continue current course Losartan 25mg , Nadolol half tab Refill meds  Future reconsider dose inc Losartan.

## 2021-12-07 NOTE — Progress Notes (Signed)
Subjective:    Patient ID: Jesus Mejia, male    DOB: 1956-08-18, 65 y.o.   MRN: [id number]  Jesus Mejia is a 65 y.o. male presenting on 12/07/2021 for Annual Exam   HPI  Accompanied by spouse, Givon Donahoo, who provides additional history.  Here for Annual Physical and Lab Review  Specialist: [facility]   Portal Hypertension PMH Hepatic Cirrhosis due to Chronic Hep C, Esophageal Varices, Portal HTN Thrombocytopenia Followed by [facility] History of varices and tachycardia Home BP readings avg 140-80 Today 144/80 on repeat He does admit misses Losartan pill in AM. Now has a pillbox. Current Meds - Losartan 25mg  daily, Nadolol 20mg  (half tablet evening) needs refills Reports good compliance, took meds today. Tolerating well, w/o complaints  Hepatic Cirrhosis due to Chronic Hep C (treated s/p Harvoni) / Complicated by Esophageal Varices, Portal HTN - Background history with prior history of chronic Hep C infection diagnosed in 0000000 with complicated cirrhosis, he was treated with Harvoni by 2015 with complete resolution of HCV infection by report and chart review - Followed by [facility] Dr Franco Collet.   Pre-Diabetes Prior history years ago with PreDM, last several readings have been normal Last result now improved to 5.5, previous 5.8 PreDM range CBGs: Not checking Meds: None Lifestyle: - Diet (still follows improved diet, reduced carb sugar)  - Exercise (Active outside regularly, walking)  Additional dx Plantar Fasciitis - podiatry, bilateral heel pain. Some relief, stretching 3 x day after activity usually    PMH - Deafness Right Ear   Health Maintenance:  PSA 0.18, negative 11/2021 screening.  UTD Flu Shot 11/16/21  Not yet updated COVID19 new booster.  Depression screen [facility] 2/9 05/31/2021 11/30/2020 05/26/2020  Decreased Interest 0 0 0  Down, Depressed, Hopeless 0 0 0  PHQ - 2 Score 0 0 0  Altered  sleeping 0 - -  Tired, decreased energy 0 - -  Change in appetite 0 - -  Feeling bad or failure about yourself  0 - -  Trouble concentrating 0 - -  Moving slowly or fidgety/restless 0 - -  Suicidal thoughts 0 - -  PHQ-9 Score 0 - -  Difficult doing work/chores Not difficult at all - -    Past Medical History:  Diagnosis Date   Acute hepatitis C    Bleeding nose    Chronic fatigue    Cirrhosis, non-alcoholic (HCC)    Elevated serum glucose    Past Surgical History:  Procedure Laterality Date   HERNIA REPAIR     Social History   Socioeconomic History   Marital status: Married    Spouse name: Not on file   Number of children: Not on file   Years of education: Not on file   Highest education level: Not on file  Occupational History   Not on file  Tobacco Use   Smoking status: Former    Packs/day: 1.00    Years: 20.00    Pack years: 20.00    Types: Cigarettes    Quit date: 09/08/1993    Years since quitting: 28.2   Smokeless tobacco: Former  Substance and Sexual Activity   Alcohol use: No    Alcohol/week: 0.0 standard drinks    Comment: former quit 14 years ago   Drug use: No    Comment: former quit 15 years ago   Sexual activity: Not on file  Other Topics Concern   Not on file  Social History Narrative  Not on file   Social Determinants of Health   Financial Resource Strain: Not on file  Food Insecurity: Not on file  Transportation Needs: Not on file  Physical Activity: Not on file  Stress: Not on file  Social Connections: Not on file  Intimate Partner Violence: Not on file   Family History  Problem Relation Age of Onset   Cancer Mother        breast   Cancer Sister        breast   Hypertension Brother    Prostate cancer Neg Hx    Current Outpatient Medications on File Prior to Visit  Medication Sig   calcium & magnesium carbonates (MYLANTA) 311-232 MG per tablet Take 1 tablet by mouth daily as needed.    losartan (COZAAR) 25 MG tablet Take 1  tablet (25 mg total) by mouth daily.   Magnesium 250 MG TABS Take by mouth. As needed   Methylsulfonylmethane 1000 MG CAPS Take 1 capsule by mouth daily as needed.   Multiple Vitamin (MULTIVITAMIN WITH MINERALS) TABS tablet Take 1 tablet by mouth daily.   nadolol (CORGARD) 20 MG tablet Take 0.5 tablets (10 mg total) by mouth every evening.   No current facility-administered medications on file prior to visit.    Review of Systems  Constitutional:  Negative for activity change, appetite change, chills, diaphoresis, fatigue and fever.  HENT:  Negative for congestion and hearing loss.   Eyes:  Negative for visual disturbance.  Respiratory:  Negative for cough, chest tightness, shortness of breath and wheezing.   Cardiovascular:  Negative for chest pain, palpitations and leg swelling.  Gastrointestinal:  Negative for abdominal pain, constipation, diarrhea, nausea and vomiting.  Genitourinary:  Negative for dysuria, frequency and hematuria.  Musculoskeletal:  Negative for arthralgias and neck pain.  Skin:  Negative for rash.  Neurological:  Negative for dizziness, weakness, light-headedness, numbness and headaches.  Hematological:  Negative for adenopathy.  Psychiatric/Behavioral:  Negative for behavioral problems, dysphoric mood and sleep disturbance.   Per HPI unless specifically indicated above      Objective:    BP (!) 144/80 (BP Location: Left Arm, Cuff Size: Normal)   Pulse (!) 45   Ht 5\' 11"  (1.803 m)   Wt 191 lb 9.6 oz (86.9 kg)   SpO2 100%   BMI 26.72 kg/m   Wt Readings from Last 3 Encounters:  12/07/21 191 lb 9.6 oz (86.9 kg)  05/31/21 176 lb 12.8 oz (80.2 kg)  11/30/20 201 lb 3.2 oz (91.3 kg)    Physical Exam Vitals and nursing note reviewed.  Constitutional:      General: He is not in acute distress.    Appearance: He is well-developed. He is not diaphoretic.     Comments: Well-appearing, comfortable, cooperative  HENT:     Head: Normocephalic and atraumatic.   Eyes:     General:        Right eye: No discharge.        Left eye: No discharge.     Conjunctiva/sclera: Conjunctivae normal.     Pupils: Pupils are equal, round, and reactive to light.  Neck:     Thyroid: No thyromegaly.     Vascular: No carotid bruit.  Cardiovascular:     Rate and Rhythm: Normal rate and regular rhythm.     Pulses: Normal pulses.     Heart sounds: Normal heart sounds. No murmur heard. Pulmonary:     Effort: Pulmonary effort is normal. No respiratory distress.  Breath sounds: Normal breath sounds. No wheezing or rales.  Abdominal:     General: Bowel sounds are normal. There is no distension.     Palpations: Abdomen is soft. There is no mass.     Tenderness: There is no abdominal tenderness.  Musculoskeletal:        General: No tenderness. Normal range of motion.     Cervical back: Normal range of motion and neck supple.     Right lower leg: No edema.     Left lower leg: No edema.     Comments: Upper / Lower Extremities: - Normal muscle tone, strength bilateral upper extremities 5/5, lower extremities 5/5  Lymphadenopathy:     Cervical: No cervical adenopathy.  Skin:    General: Skin is warm and dry.     Findings: No erythema or rash.  Neurological:     Mental Status: He is alert and oriented to person, place, and time.     Comments: Distal sensation intact to light touch all extremities  Psychiatric:        Mood and Affect: Mood normal.        Behavior: Behavior normal.        Thought Content: Thought content normal.     Comments: Well groomed, good eye contact, normal speech and thoughts      Results for orders placed or performed in visit on 11/29/21  TSH  Result Value Ref Range   TSH 4.76 (H) 0.40 - 4.50 mIU/L  PSA  Result Value Ref Range   PSA 0.18 < OR = 4.00 ng/mL  Lipid panel  Result Value Ref Range   Cholesterol 139 <200 mg/dL   HDL 41 > OR = 40 mg/dL   Triglycerides 156 (H) <150 mg/dL   LDL Cholesterol (Calc) 75 mg/dL (calc)    Total CHOL/HDL Ratio 3.4 <5.0 (calc)   Non-HDL Cholesterol (Calc) 98 <130 mg/dL (calc)  HgB A1c  Result Value Ref Range   Hgb A1c MFr Bld 5.5 <5.7 % of total Hgb   Mean Plasma Glucose 111 mg/dL   eAG (mmol/L) 6.2 mmol/L      Assessment & Plan:   Problem List Items Addressed This Visit     Thrombocytopenia ([place])    Secondary to cirrhosis Stable mild low plt Followed by hepatology      Prediabetes    Controlled A1c 5.5 Improved on low carb diet Continue to improve lifestyle maintain A1c  Repeat 6 month      Portal hypertension (HCC)    Mild elevated BP, repeat improved Overall controlled, asymptomatic Otherwise stable, without complication Continues on beta blocker Nadolol Followed by [facility] for surveillance  Plan: - Continue current course Losartan 25mg , Nadolol half tab Refill meds  Future reconsider dose inc Losartan.      Hyperlipidemia    Controlled cholesterol on lifestyle Last lipid panel 11/2021 The 10-year ASCVD risk score (Arnett DK, et al., 2019) is: 11.4%   Plan: 1. Defer statin, declined and patient has cirrhosis 2. Encourage improved lifestyle - low carb/cholesterol, reduce portion size, continue improving regular exercise      Esophageal varices in cirrhosis (HCC)    Stable, without complication. Continues on beta blocker Nadolol. Followed by [facility] for surveillance      Other Visit Diagnoses     Annual physical exam    -  Primary       Updated Health Maintenance information Interested in Shingles vaccine in future UTD Flu shot Reviewed recent lab results with  patient Encouraged improvement to lifestyle with diet and exercise Goal of weight loss  Plantar Fasciitis - recommend Tension Night Splint, adjust his stretching routine, handout AVS given stretch BEFORE activity. Topical NSAID if need.  Elevated TSH mild Will repeat 6 months and include T4  No orders of the defined types were placed in this encounter.    Follow up  plan: Return in about 6 months (around 06/07/2022) for 6 month fasting lab only then 1 week later Follow-up HTN, abnormal TSH, PreDM, Plantar Fascia.  Future labs 6 months TSH + T4, A1c  Saralyn Pilar, DO [facility] 12/07/2021, 8:15 AM

## 2021-12-07 NOTE — Patient Instructions (Addendum)
Thank you for coming to the office today.  Keep a watch on BP goal < 140 /90 may adjust med in future if still elevated  Recommend Shingrix vaccine for shingles 2 doses 2-6 month apart, very good immunity to protect against long term.  Tension Night Splint - wear at night to help stretch the foot while you sleep!  You most likely have Plantar Fasciitis of heel / foot. - This is inflammation of the fibrous connection on the bottom of the foot, and can have small micro tears over time that become painful. It usually will have flare ups lasting days to weeks, and may come back after it heals if it is re-aggravated again. - Often there is a bone spur or arthritis of the heel bone that causes this - Also it may be caused by abnormal footwear, walking pattern or other problems  If you are experiencing an acute flare with pain, this is usually worst first thing in the morning when the plantar fascia is tight and stiff. First step out of bed is painful usually, and it may gradually improve with stretching and walking.  START anti inflammatory topical - OTC Voltaren (generic Diclofenac) topical 2-4 times a day as needed for pain swelling of affected joint for 1-2 weeks or longer.  Recommend: - Rest / relative rest with activity modification avoid overuse / prolonged stand - Ice packs (make sure you use a towel or sock / something to protect skin)  May also try topical muscle rub, icy hot, tiger balm  Start the exercises listed below, gradually increase them as instructed. May be sore at first and hopefully will stretch out and help reduce pain later.  If not improving after 1-2 weeks on medicine, and stretching exercises and some rest - then can contact our office again for re-evaluation may consider other causes or can refer you to Orthopedic specialist to have second opinion, and consider a steroid injection.  DUE for FASTING BLOOD WORK (no food or drink after midnight before the lab appointment,  only water or coffee without cream/sugar on the morning of)  SCHEDULE "Lab Only" visit in the morning at the clinic for lab draw in 6 MONTHS   - Make sure Lab Only appointment is at about 1 week before your next appointment, so that results will be available  For Lab Results, once available within 2-3 days of blood draw, you can can log in to MyChart online to view your results and a brief explanation. Also, we can discuss results at next follow-up visit.     Please schedule a Follow-up Appointment to: Return in about 6 months (around 06/07/2022) for 6 month fasting lab only then 1 week later Follow-up HTN, abnormal TSH, PreDM, Plantar Fascia.  If you have any other questions or concerns, please feel free to call the office or send a message through MyChart. You may also schedule an earlier appointment if necessary.  Additionally, you may be receiving a survey about your experience at our office within a few days to 1 week by e-mail or mail. We value your feedback.  Saralyn Pilar, DO [facility], [facility]              Plantar Fascia Stretches / Exercises  See other page with pictures of each exercise.  Start with 1 or 2 of these exercises that you are most comfortable with. Do not do any exercises that cause you significant worsening pain. Some of these may cause some "stretching  soreness" but it should go away after you stop the exercise, and get better over time. Gradually increase up to 3-4 exercises as tolerated.  You may begin exercising the muscles of your foot right away by gently stretching them as follows:  Stretching: Towel stretch: Sit on a hard surface with your injured leg stretched out in front of you. Loop a towel around the ball of your foot and pull the towel toward your body keeping your knee straight. Hold this position for 15 to 30 seconds then relax. Repeat 3 times. When the towel stretch becomes to easy, you may begin doing the  standing calf stretch.  Standing calf stretch: Facing a wall, put your hands against the wall at about eye level. Keep the injured leg back, the uninjured leg forward, and the heel of your injured leg on the floor. Turn your injured foot slightly inward (as if you were pigeon-toed) as you slowly lean into the wall until you feel a stretch in the back of your calf. Hold for 15 to 30 seconds. Repeat 3 times. Do this exercise several times each day. When you can stand comfortably on your injured foot, you can begin stretching the bottom of your foot using the plantar fascia stretch.  Plantar fascia stretch: Stand with the ball of your injured foot on a stair. Reach for the bottom step with your heel until you feel a stretch in the arch of your foot. Hold this position for 15 to 30 seconds and then relax. Repeat 3 times. After you have stretched the bottom muscles of your foot, you can begin strengthening the top muscles of your foot.  Frozen can roll: Roll your bare injured foot back and forth from your heel to your mid-arch over a frozen juice can. Repeat for 3 to 5 minutes. This exercise is particularly helpful if done first thing in the morning. Towel pickup: With your heel on the ground, pick up a towel with your toes. Release. Repeat 10 to 20 times. When this gets easy, add more resistance by placing a book or small weight on the towel. Static and dynamic balance exercises Place a chair next to your non-injured leg and stand upright. (This will provide you with balance if needed.) Stand on your injured foot. Try to raise the arch of your foot while keeping your toes on the floor. Try to maintain this position and balance on your injured side for 30 seconds. This exercise can be made more difficult by doing it on a piece of foam or a pillow, or with your eyes closed. Stand in the same position as above. Keep your foot in this position and reach forward in front of you with your injured side's hand,  allowing your knee to bend. Repeat this 10 times while maintaining the arch height. This exercise can be made more difficult by reaching farther in front of you. Do 2 sets. Stand in the same position as above. While maintaining your arch height, reach the injured side's hand across your body toward the chair. The farther you reach, the more challenging the exercise. Do 2 sets of 10.  Next, you can begin strengthening the muscles of your foot and lower leg by using elastic tubing.  Strengthening: Resisted dorsiflexion: Sit with your injured leg out straight and your foot facing a doorway. Tie a loop in one end of the tubing. Put your foot through the loop so that the tubing goes around the arch of your foot. Tie a knot  in the other end of the tubing and shut the knot in the door. Move backward until there is tension in the tubing. Keeping your knee straight, pull your foot toward your body, stretching the tubing. Slowly return to the starting position. Do 3 sets of 10. Resisted plantar flexion: Sit with your leg outstretched and loop the middle section of the tubing around the ball of your foot. Hold the ends of the tubing in both hands. Gently press the ball of your foot down and point your toes, stretching the tubing. Return to the starting position. Do 3 sets of 10. Resisted inversion: Sit with your legs out straight and cross your uninjured leg over your injured ankle. Wrap the tubing around the ball of your injured foot and then loop it around your uninjured foot so that the tubing is anchored there at one end. Hold the other end of the tubing in your hand. Turn your injured foot inward and upward. This will stretch the tubing. Return to the starting position. Do 3 sets of 10. Resisted eversion: Sit with both legs stretched out in front of you, with your feet about a shoulder's width apart. Tie a loop in one end of the tubing. Put your injured foot through the loop so that the tubing goes around the  arch of that foot and wraps around the outside of the uninjured foot. Hold onto the other end of the tubing with your hand to provide tension. Turn your injured foot up and out. Make sure you keep your uninjured foot still so that it will allow the tubing to stretch as you move your injured foot. Return to the starting position. Do 3 sets of 10.

## 2021-12-07 NOTE — Assessment & Plan Note (Signed)
Controlled cholesterol on lifestyle Last lipid panel 11/2021 The 10-year ASCVD risk score (Arnett DK, et al., 2019) is: 11.4%   Plan: 1. Defer statin, declined and patient has cirrhosis 2. Encourage improved lifestyle - low carb/cholesterol, reduce portion size, continue improving regular exercise

## 2021-12-07 NOTE — Assessment & Plan Note (Signed)
Stable, without complication. Continues on beta blocker Nadolol. Followed by [facility] for surveillance 

## 2022-05-31 ENCOUNTER — Other Ambulatory Visit: Payer: 59

## 2022-05-31 DIAGNOSIS — R7303 Prediabetes: Secondary | ICD-10-CM | POA: Diagnosis not present

## 2022-05-31 DIAGNOSIS — R7989 Other specified abnormal findings of blood chemistry: Secondary | ICD-10-CM

## 2022-06-01 LAB — TSH: TSH: 2.66 mIU/L (ref 0.40–4.50)

## 2022-06-01 LAB — T4, FREE: Free T4: 0.8 ng/dL (ref 0.8–1.8)

## 2022-06-01 LAB — HEMOGLOBIN A1C
Hgb A1c MFr Bld: 5.3 % of total Hgb (ref <5.7)
Mean Plasma Glucose: 105 mg/dL
eAG (mmol/L): 5.8 mmol/L

## 2022-06-07 ENCOUNTER — Ambulatory Visit: Payer: 59 | Admitting: Family Medicine

## 2022-06-07 ENCOUNTER — Other Ambulatory Visit: Payer: Self-pay | Admitting: Family Medicine

## 2022-06-07 ENCOUNTER — Encounter: Payer: Self-pay | Admitting: Family Medicine

## 2022-06-07 VITALS — BP 130/66 | HR 49 | Ht 71.0 in | Wt 185.6 lb

## 2022-06-07 DIAGNOSIS — Z0000 Encounter for general adult medical examination without abnormal findings: Secondary | ICD-10-CM

## 2022-06-07 DIAGNOSIS — E782 Mixed hyperlipidemia: Secondary | ICD-10-CM

## 2022-06-07 DIAGNOSIS — R7303 Prediabetes: Secondary | ICD-10-CM

## 2022-06-07 DIAGNOSIS — D696 Thrombocytopenia, unspecified: Secondary | ICD-10-CM

## 2022-06-07 DIAGNOSIS — K7460 Unspecified cirrhosis of liver: Secondary | ICD-10-CM

## 2022-06-07 DIAGNOSIS — R252 Cramp and spasm: Secondary | ICD-10-CM

## 2022-06-07 DIAGNOSIS — Z125 Encounter for screening for malignant neoplasm of prostate: Secondary | ICD-10-CM

## 2022-06-07 DIAGNOSIS — B182 Chronic viral hepatitis C: Secondary | ICD-10-CM

## 2022-06-07 DIAGNOSIS — R7989 Other specified abnormal findings of blood chemistry: Secondary | ICD-10-CM

## 2022-06-07 NOTE — Patient Instructions (Addendum)
Thank you for coming to the office today.  Recent Labs    11/30/21 0805 05/31/22 0837  HGBA1C 5.5 5.3   Thyroid has normalized. No new concerns. Looks good.   Leg cramps - Try spoonful of yellow mustard to relieve leg cramps or try daily to prevent the problem  - OTC natural option is Hyland's Leg Cramps (Dissolving tablet) take as needed for muscle cramps   DUE for FASTING BLOOD WORK (no food or drink after midnight before the lab appointment, only water or coffee without cream/sugar on the morning of)  SCHEDULE "Lab Only" visit in the morning at the clinic for lab draw in 6 MONTHS   - Make sure Lab Only appointment is at about 1 week before your next appointment, so that results will be available  For Lab Results, once available within 2-3 days of blood draw, you can can log in to MyChart online to view your results and a brief explanation. Also, we can discuss results at next follow-up visit.    Please schedule a Follow-up Appointment to: Return in about 6 months (around 12/07/2022) for 6 month fasting lab only then 1 week later Annual Physical.  If you have any other questions or concerns, please feel free to call the office or send a message through MyChart. You may also schedule an earlier appointment if necessary.  Additionally, you may be receiving a survey about your experience at our office within a few days to 1 week by e-mail or mail. We value your feedback.  Saralyn Pilar, DO [facility], [place]

## 2022-06-07 NOTE — Progress Notes (Signed)
Subjective:    Patient ID: Jesus Mejia, male    DOB: 1956-01-27, 66 y.o.   MRN: [id number]  Jesus Mejia is a 66 y.o. male presenting on 06/07/2022 for Hypertension and Prediabetes   HPI  Specialist: [facility]   Portal Hypertension PMH Hepatic Cirrhosis due to Chronic Hep C, Esophageal Varices, Portal HTN Thrombocytopenia Followed by [facility] History of varices and tachycardia Home BP readings avg 140-80 Today 144/80 on repeat He does admit misses Losartan pill in AM. Now has a pillbox. Current Meds - Losartan 25mg  daily, Nadolol 20mg  (half tablet evening) needs refills Reports good compliance, took meds today. Tolerating well, w/o complaints   Hepatic Cirrhosis due to Chronic Hep C (treated s/p Harvoni) / Complicated by Esophageal Varices, Portal HTN - Background history with prior history of chronic Hep C infection diagnosed in 0000000 with complicated cirrhosis, he was treated with Harvoni by 2015 with complete resolution of HCV infection by report and chart review - Followed by [facility] GI-Hepatology Dr Franco Collet.   Pre-Diabetes Prior history years ago with PreDM, last several readings have been normal Last result showed A1c 5.3 CBGs: Not checking Meds: None Lifestyle: - Diet (still follows improved diet, reduced carb sugar)  - Exercise (Active outside regularly, walking, is a beekeeper and remains active walking)   TSH Elevated Normalized, previous lab 11/2021 showed TSH 4.7 now down to 2.6 improved. T4 0.8 normal Never dx. Not treated at this time.  Improved plantar fasciitis Previously stretching more.  Muscle Cramping, lower extremity Thighs bilateral, in evening, has occasional muscle cramps. He can tell it may happen that night. He tried Magnesium and it has improved    PMH - Deafness Right Ear      06/07/2022    8:21 AM 05/31/2021    8:15 AM 11/30/2020    8:40 AM  Depression screen PHQ 2/9  Decreased  Interest 0 0 0  Down, Depressed, Hopeless 0 0 0  PHQ - 2 Score 0 0 0  Altered sleeping 0 0   Tired, decreased energy 0 0   Change in appetite 0 0   Feeling bad or failure about yourself  0 0   Trouble concentrating 0 0   Moving slowly or fidgety/restless 0 0   Suicidal thoughts 0 0   PHQ-9 Score 0 0   Difficult doing work/chores Not difficult at all Not difficult at all     Social History   Tobacco Use   Smoking status: Former    Packs/day: 1.00    Years: 20.00    Total pack years: 20.00    Types: Cigarettes    Quit date: 09/08/1993    Years since quitting: 28.7   Smokeless tobacco: Former  Substance Use Topics   Alcohol use: No    Alcohol/week: 0.0 standard drinks of alcohol    Comment: former quit 14 years ago   Drug use: No    Comment: former quit 15 years ago    Review of Systems Per HPI unless specifically indicated above     Objective:    BP 130/66   Pulse (!) 49   Ht 5\' 11"  (1.803 m)   Wt 185 lb 9.6 oz (84.2 kg)   SpO2 100%   BMI 25.89 kg/m   Wt Readings from Last 3 Encounters:  06/07/22 185 lb 9.6 oz (84.2 kg)  12/07/21 191 lb 9.6 oz (86.9 kg)  05/31/21 176 lb 12.8 oz (80.2 kg)    Physical  Exam Vitals and nursing note reviewed.  Constitutional:      General: He is not in acute distress.    Appearance: Normal appearance. He is well-developed. He is not diaphoretic.     Comments: Well-appearing, comfortable, cooperative  HENT:     Head: Normocephalic and atraumatic.  Eyes:     General:        Right eye: No discharge.        Left eye: No discharge.     Conjunctiva/sclera: Conjunctivae normal.  Cardiovascular:     Rate and Rhythm: Normal rate.  Pulmonary:     Effort: Pulmonary effort is normal.  Skin:    General: Skin is warm and dry.     Findings: No erythema or rash.  Neurological:     Mental Status: He is alert and oriented to person, place, and time.  Psychiatric:        Mood and Affect: Mood normal.        Behavior: Behavior normal.         Thought Content: Thought content normal.     Comments: Well groomed, good eye contact, normal speech and thoughts      Results for orders placed or performed in visit on 05/31/22  T4, free  Result Value Ref Range   Free T4 0.8 0.8 - 1.8 ng/dL  TSH  Result Value Ref Range   TSH 2.66 0.40 - 4.50 mIU/L  Hemoglobin A1c  Result Value Ref Range   Hgb A1c MFr Bld 5.3 <5.7 % of total Hgb   Mean Plasma Glucose 105 mg/dL   eAG (mmol/L) 5.8 mmol/L      Assessment & Plan:   Problem List Items Addressed This Visit     Prediabetes - Primary   Hepatic cirrhosis due to chronic hepatitis C infection ([place])   Other Visit Diagnoses     Elevated TSH       Muscle cramping           TSH normalized. T4 normal Reassurance, will repeat lab in 6 months. No new dx or treatment required  Cirrhosis Followed by GI Hepatology No changes since last visit, on yearly surveillance.  PreDM Improved A1c to 5.3 Encourage improving lifestyle diet / exercise  Muscle cramps Remedy given per AVS  No orders of the defined types were placed in this encounter.    Follow up plan: Return in about 6 months (around 12/07/2022) for 6 month fasting lab only then 1 week later Annual Physical.  Future labs ordered for 11/2022   Jesus Mejia, [facility] 06/07/2022, 8:24 AM

## 2022-10-13 DIAGNOSIS — K7460 Unspecified cirrhosis of liver: Secondary | ICD-10-CM | POA: Diagnosis not present

## 2022-10-13 DIAGNOSIS — K7689 Other specified diseases of liver: Secondary | ICD-10-CM | POA: Diagnosis not present

## 2022-10-24 ENCOUNTER — Other Ambulatory Visit: Payer: Self-pay | Admitting: Family Medicine

## 2022-10-24 DIAGNOSIS — K7460 Unspecified cirrhosis of liver: Secondary | ICD-10-CM

## 2022-10-24 DIAGNOSIS — K766 Portal hypertension: Secondary | ICD-10-CM

## 2022-10-25 NOTE — Telephone Encounter (Signed)
Requested Prescriptions  Pending Prescriptions Disp Refills  . nadolol (CORGARD) 20 MG tablet [Pharmacy Med Name: NADOLOL 20 MG TABLET] 45 tablet 0    Sig: TAKE 0.5 TABLETS (10 MG TOTAL) BY MOUTH EVERY EVENING.     Cardiovascular: Beta Blockers 2 Failed - 10/24/2022  3:02 PM      Failed - Cr in normal range and within 360 days    Creat  Date Value Ref Range Status  11/05/2018 0.78 0.70 - 1.25 mg/dL Final    Comment:    For patients >64 years of age, the reference limit for Creatinine is approximately 13% higher for people identified as African-American. .          Passed - Last BP in normal range    BP Readings from Last 1 Encounters:  06/07/22 130/66         Passed - Last Heart Rate in normal range    Pulse Readings from Last 1 Encounters:  06/07/22 (!) 49         Passed - Valid encounter within last 6 months    Recent Outpatient Visits          4 months ago Prediabetes   [facility] [place], Devonne Doughty, DO   10 months ago Annual physical exam   [facility] Olin Hauser, DO   1 year ago Prediabetes   [place], Devonne Doughty, DO   1 year ago Annual physical exam   [facility] Olin Hauser, DO   2 years ago Portal hypertension [facility])   [facility] [place], Devonne Doughty, DO      Future Appointments            In 1 month [place], Devonne Doughty, [facility], [facility]

## 2022-10-27 ENCOUNTER — Other Ambulatory Visit: Payer: Self-pay | Admitting: Family Medicine

## 2022-10-27 DIAGNOSIS — K766 Portal hypertension: Secondary | ICD-10-CM

## 2022-10-27 NOTE — Telephone Encounter (Signed)
Requested Prescriptions  Pending Prescriptions Disp Refills   losartan (COZAAR) 25 MG tablet [Pharmacy Med Name: LOSARTAN POTASSIUM 25 MG TAB] 90 tablet 0    Sig: TAKE 1 TABLET (25 MG TOTAL) BY MOUTH DAILY.     Cardiovascular:  Angiotensin Receptor Blockers Failed - 10/27/2022 12:30 PM      Failed - Cr in normal range and within 180 days    Creat  Date Value Ref Range Status  11/05/2018 0.78 0.70 - 1.25 mg/dL Final    Comment:    For patients >59 years of age, the reference limit for Creatinine is approximately 13% higher for people identified as African-American. .          Failed - K in normal range and within 180 days    Potassium  Date Value Ref Range Status  11/05/2018 4.0 3.5 - 5.3 mmol/L Final         Passed - Patient is not pregnant      Passed - Last BP in normal range    BP Readings from Last 1 Encounters:  06/07/22 130/66         Passed - Valid encounter within last 6 months    Recent Outpatient Visits           4 months ago Prediabetes   [facility] [place], Devonne Doughty, DO   10 months ago Annual physical exam   [facility] Olin Hauser, DO   1 year ago Prediabetes   [place], Devonne Doughty, DO   1 year ago Annual physical exam   [facility] Olin Hauser, DO   2 years ago Portal hypertension [facility])   [facility] [place], Devonne Doughty, DO       Future Appointments             In 1 month [place], Devonne Doughty, [facility], [facility]

## 2022-11-03 ENCOUNTER — Other Ambulatory Visit: Payer: Self-pay | Admitting: Family Medicine

## 2022-11-03 DIAGNOSIS — I8510 Secondary esophageal varices without bleeding: Secondary | ICD-10-CM

## 2022-11-03 DIAGNOSIS — K766 Portal hypertension: Secondary | ICD-10-CM

## 2022-11-03 DIAGNOSIS — K7460 Unspecified cirrhosis of liver: Secondary | ICD-10-CM

## 2022-11-03 NOTE — Telephone Encounter (Signed)
Last RF 10/25/22 #45   Requested Prescriptions  Refused Prescriptions Disp Refills   nadolol (CORGARD) 20 MG tablet [Pharmacy Med Name: NADOLOL 20 MG TABLET] 270 tablet 1    Sig: TAKE 0.5 TABLETS (10 MG TOTAL) BY MOUTH EVERY EVENING.     Cardiovascular: Beta Blockers 2 Failed - 11/03/2022  8:33 AM      Failed - Cr in normal range and within 360 days    Creat  Date Value Ref Range Status  11/05/2018 0.78 0.70 - 1.25 mg/dL Final    Comment:    For patients >37 years of age, the reference limit for Creatinine is approximately 13% higher for people identified as African-American. .          Passed - Last BP in normal range    BP Readings from Last 1 Encounters:  06/07/22 130/66         Passed - Last Heart Rate in normal range    Pulse Readings from Last 1 Encounters:  06/07/22 (!) 49         Passed - Valid encounter within last 6 months    Recent Outpatient Visits           4 months ago Prediabetes   [facility] Smitty Cords, DO   11 months ago Annual physical exam   [facility] Smitty Cords, DO   1 year ago Prediabetes   [facility] Althea Charon, Netta Neat, DO   1 year ago Annual physical exam   [facility] Smitty Cords, DO   2 years ago Portal hypertension [facility])   [facility] Althea Charon, Netta Neat, DO       Future Appointments             In 1 month Althea Charon, Netta Neat, DO [facility], [facility]

## 2022-11-07 ENCOUNTER — Other Ambulatory Visit: Payer: Self-pay | Admitting: Family Medicine

## 2022-11-07 DIAGNOSIS — K766 Portal hypertension: Secondary | ICD-10-CM

## 2022-11-07 DIAGNOSIS — I8510 Secondary esophageal varices without bleeding: Secondary | ICD-10-CM

## 2022-11-08 NOTE — Telephone Encounter (Signed)
Refilled 10/25/2022 #45 0 rf-confirmed by same pharmacy. Requested Prescriptions  Pending Prescriptions Disp Refills   nadolol (CORGARD) 20 MG tablet [Pharmacy Med Name: NADOLOL 20 MG TABLET] 45 tablet 0    Sig: TAKE 0.5 TABLETS (10 MG TOTAL) BY MOUTH EVERY EVENING.     Cardiovascular: Beta Blockers 2 Failed - 11/07/2022  3:14 PM      Failed - Cr in normal range and within 360 days    Creat  Date Value Ref Range Status  11/05/2018 0.78 0.70 - 1.25 mg/dL Final    Comment:    For patients >46 years of age, the reference limit for Creatinine is approximately 13% higher for people identified as African-American. .          Passed - Last BP in normal range    BP Readings from Last 1 Encounters:  06/07/22 130/66         Passed - Last Heart Rate in normal range    Pulse Readings from Last 1 Encounters:  06/07/22 (!) 49         Passed - Valid encounter within last 6 months    Recent Outpatient Visits           5 months ago Prediabetes   [facility] Smitty Cords, DO   11 months ago Annual physical exam   [facility] Smitty Cords, DO   1 year ago Prediabetes   [facility] Althea Charon, Netta Neat, DO   1 year ago Annual physical exam   [facility] Smitty Cords, DO   2 years ago Portal hypertension [facility])   [facility] Althea Charon, Netta Neat, DO       Future Appointments             In 1 month Althea Charon, Netta Neat, DO [facility], [facility]

## 2022-11-15 DIAGNOSIS — K7689 Other specified diseases of liver: Secondary | ICD-10-CM | POA: Diagnosis not present

## 2022-11-15 DIAGNOSIS — K7460 Unspecified cirrhosis of liver: Secondary | ICD-10-CM | POA: Diagnosis not present

## 2022-11-15 DIAGNOSIS — K766 Portal hypertension: Secondary | ICD-10-CM | POA: Diagnosis not present

## 2022-11-30 ENCOUNTER — Other Ambulatory Visit: Payer: Self-pay

## 2022-11-30 DIAGNOSIS — B182 Chronic viral hepatitis C: Secondary | ICD-10-CM

## 2022-11-30 DIAGNOSIS — E782 Mixed hyperlipidemia: Secondary | ICD-10-CM

## 2022-11-30 DIAGNOSIS — Z0000 Encounter for general adult medical examination without abnormal findings: Secondary | ICD-10-CM

## 2022-11-30 DIAGNOSIS — D696 Thrombocytopenia, unspecified: Secondary | ICD-10-CM

## 2022-11-30 DIAGNOSIS — R7303 Prediabetes: Secondary | ICD-10-CM

## 2022-11-30 DIAGNOSIS — Z125 Encounter for screening for malignant neoplasm of prostate: Secondary | ICD-10-CM

## 2022-11-30 DIAGNOSIS — R7989 Other specified abnormal findings of blood chemistry: Secondary | ICD-10-CM

## 2022-12-01 ENCOUNTER — Other Ambulatory Visit: Payer: 59

## 2022-12-01 DIAGNOSIS — R7303 Prediabetes: Secondary | ICD-10-CM | POA: Diagnosis not present

## 2022-12-01 DIAGNOSIS — R7989 Other specified abnormal findings of blood chemistry: Secondary | ICD-10-CM | POA: Diagnosis not present

## 2022-12-01 DIAGNOSIS — E782 Mixed hyperlipidemia: Secondary | ICD-10-CM | POA: Diagnosis not present

## 2022-12-01 DIAGNOSIS — R69 Illness, unspecified: Secondary | ICD-10-CM | POA: Diagnosis not present

## 2022-12-01 DIAGNOSIS — D696 Thrombocytopenia, unspecified: Secondary | ICD-10-CM | POA: Diagnosis not present

## 2022-12-01 DIAGNOSIS — Z125 Encounter for screening for malignant neoplasm of prostate: Secondary | ICD-10-CM | POA: Diagnosis not present

## 2022-12-01 DIAGNOSIS — Z0000 Encounter for general adult medical examination without abnormal findings: Secondary | ICD-10-CM | POA: Diagnosis not present

## 2022-12-01 DIAGNOSIS — K7460 Unspecified cirrhosis of liver: Secondary | ICD-10-CM | POA: Diagnosis not present

## 2022-12-02 LAB — COMPLETE METABOLIC PANEL WITH GFR
AG Ratio: 2.2 (calc) (ref 1.0–2.5)
ALT: 13 U/L (ref 9–46)
AST: 20 U/L (ref 10–35)
Albumin: 4.6 g/dL (ref 3.6–5.1)
Alkaline phosphatase (APISO): 59 U/L (ref 35–144)
BUN: 16 mg/dL (ref 7–25)
CO2: 27 mmol/L (ref 20–32)
Calcium: 10 mg/dL (ref 8.6–10.3)
Chloride: 108 mmol/L (ref 98–110)
Creat: 0.83 mg/dL (ref 0.70–1.35)
Globulin: 2.1 g/dL (calc) (ref 1.9–3.7)
Glucose, Bld: 146 mg/dL — ABNORMAL HIGH (ref 65–99)
Potassium: 4.1 mmol/L (ref 3.5–5.3)
Sodium: 143 mmol/L (ref 135–146)
Total Bilirubin: 1.6 mg/dL — ABNORMAL HIGH (ref 0.2–1.2)
Total Protein: 6.7 g/dL (ref 6.1–8.1)
eGFR: 98 mL/min/{1.73_m2} (ref >=60)

## 2022-12-02 LAB — CBC WITH DIFFERENTIAL/PLATELET
Absolute Monocytes: 367 cells/uL (ref 200–950)
Basophils Absolute: 31 cells/uL (ref 0–200)
Basophils Relative: 0.8 %
Eosinophils Absolute: 82 cells/uL (ref 15–500)
Eosinophils Relative: 2.1 %
HCT: 43.5 % (ref 38.5–50.0)
Hemoglobin: 15.2 g/dL (ref 13.2–17.1)
Lymphs Abs: 1377 cells/uL (ref 850–3900)
MCH: 30.8 pg (ref 27.0–33.0)
MCHC: 34.9 g/dL (ref 32.0–36.0)
MCV: 88.1 fL (ref 80.0–100.0)
MPV: 10.7 fL (ref 7.5–12.5)
Monocytes Relative: 9.4 %
Neutro Abs: 2044 cells/uL (ref 1500–7800)
Neutrophils Relative %: 52.4 %
Platelets: 104 10*3/uL — ABNORMAL LOW (ref 140–400)
RBC: 4.94 10*6/uL (ref 4.20–5.80)
RDW: 13.2 % (ref 11.0–15.0)
Total Lymphocyte: 35.3 %
WBC: 3.9 10*3/uL (ref 3.8–10.8)

## 2022-12-02 LAB — HEMOGLOBIN A1C
Hgb A1c MFr Bld: 5.8 % of total Hgb — ABNORMAL HIGH (ref <5.7)
Mean Plasma Glucose: 120 mg/dL
eAG (mmol/L): 6.6 mmol/L

## 2022-12-02 LAB — LIPID PANEL
Cholesterol: 148 mg/dL (ref <200)
HDL: 40 mg/dL (ref >=40)
LDL Cholesterol (Calc): 87 mg/dL (calc)
Non-HDL Cholesterol (Calc): 108 mg/dL (calc) (ref <130)
Total CHOL/HDL Ratio: 3.7 (calc) (ref <5.0)
Triglycerides: 110 mg/dL (ref <150)

## 2022-12-02 LAB — T4, FREE: Free T4: 0.9 ng/dL (ref 0.8–1.8)

## 2022-12-02 LAB — TSH: TSH: 2.24 mIU/L (ref 0.40–4.50)

## 2022-12-02 LAB — PSA: PSA: 0.19 ng/mL (ref <=4.00)

## 2022-12-09 ENCOUNTER — Encounter: Payer: Self-pay | Admitting: Family Medicine

## 2022-12-09 ENCOUNTER — Ambulatory Visit (INDEPENDENT_AMBULATORY_CARE_PROVIDER_SITE_OTHER): Payer: 59 | Admitting: Family Medicine

## 2022-12-09 VITALS — BP 128/80 | HR 50 | Ht 71.0 in | Wt 189.0 lb

## 2022-12-09 DIAGNOSIS — D696 Thrombocytopenia, unspecified: Secondary | ICD-10-CM

## 2022-12-09 DIAGNOSIS — K7460 Unspecified cirrhosis of liver: Secondary | ICD-10-CM | POA: Diagnosis not present

## 2022-12-09 DIAGNOSIS — I8510 Secondary esophageal varices without bleeding: Secondary | ICD-10-CM | POA: Diagnosis not present

## 2022-12-09 DIAGNOSIS — R7303 Prediabetes: Secondary | ICD-10-CM | POA: Diagnosis not present

## 2022-12-09 DIAGNOSIS — Z23 Encounter for immunization: Secondary | ICD-10-CM | POA: Diagnosis not present

## 2022-12-09 DIAGNOSIS — K766 Portal hypertension: Secondary | ICD-10-CM | POA: Diagnosis not present

## 2022-12-09 DIAGNOSIS — Z0000 Encounter for general adult medical examination without abnormal findings: Secondary | ICD-10-CM | POA: Diagnosis not present

## 2022-12-09 MED ORDER — NADOLOL 20 MG PO TABS: 10.0000 mg | ORAL_TABLET | Freq: Every evening | ORAL | 3 refills | Status: DC

## 2022-12-09 MED ORDER — LOSARTAN POTASSIUM 25 MG PO TABS: 25.0000 mg | ORAL_TABLET | Freq: Every day | ORAL | 3 refills | Status: DC

## 2022-12-09 NOTE — Assessment & Plan Note (Signed)
Mild elevated A1c to 5.8 now Emphasis diet changes. Continue to improve lifestyle maintain A1c  Repeat 6 month

## 2022-12-09 NOTE — Assessment & Plan Note (Addendum)
Mild elevated BP, repeat improved Overall controlled, asymptomatic Otherwise stable, without complication Continues on beta blocker Nadolol Followed by [facility] for surveillance  Plan: - Continue current course Losartan 25mg , Nadolol half tab Refill meds

## 2022-12-09 NOTE — Assessment & Plan Note (Signed)
Stable, without complication. Continues on beta blocker Nadolol. Followed by [facility] for surveillance

## 2022-12-09 NOTE — Patient Instructions (Addendum)
RThank you for coming to the office today.  Flu Shot today  Next dose Pneumonia vaccine is due after 65th birthday, Prevnar-20, one dose and then done. At pharmacy or here  Recent Labs    05/31/22 0837 12/01/22 0811  HGBA1C 5.3 5.8*   Goal to limit carb starches sugars  Repeat in 6 months  Please schedule a Follow-up Appointment to: Return in about 6 months (around 06/10/2023), or if symptoms worsen or fail to improve, for 6 month PreDM A1c.  If you have any other questions or concerns, please feel free to call the office or send a message through MyChart. You may also schedule an earlier appointment if necessary.  Additionally, you may be receiving a survey about your experience at our office within a few days to 1 week by e-mail or mail. We value your feedback.  Saralyn Pilar, DO [facility], [place]

## 2022-12-09 NOTE — Progress Notes (Signed)
Subjective:    Patient ID: Jesus Mejia, male    DOB: 1956-01-09, 66 y.o.   MRN: [id number]  Jesus Mejia is a 66 y.o. male presenting on 12/09/2022 for Annual Exam   HPI  Specialist: [facility]   Portal Hypertension PMH Hepatic Cirrhosis due to Chronic Hep C, Esophageal Varices, Portal HTN Thrombocytopenia Followed by [facility] History of varices and tachycardia BP improved today He does admit misses Losartan pill in AM. Now has a pillbox. Current Meds - Losartan 62m daily, Nadolol 270m(half tablet evening) needs refills Reports good compliance, took meds today. Tolerating well, w/o complaints   Hepatic Cirrhosis due to Chronic Hep C (treated s/p Harvoni) / Complicated by Esophageal Varices, Portal HTN - Background history with prior history of chronic Hep C infection diagnosed in [id number] complicated cirrhosis, he was treated with Harvoni by 2015 with complete resolution of HCV infection by report and chart review - [facility] Je[facility]anco Collet  Pre-Diabetes Prior history years ago with PreDM, last several readings have been normal Last result showed A1c 5.8, higher than average. CBGs: Not checking Meds: None Lifestyle: - Diet (still follows improved diet, reduced carb sugar)  - Exercise (Active outside regularly, walking, is a beekeeper and remains active walking)   TSH Elevated Normalized, previous lab 11/2021 to 11/2022 still normal improved.  showed TSH 4.7 now down to 2.6 and then to 2.24 normalized, also T4 0.9 normal Never dx. Not treated at this time.    R Foot inner toenail/bed pain some ingrown aspect with ambulation.    PMH - Deafness Right Ear  Health Maintenance:  Flu Shot today  [facility] and pneumovax23 04/18/12 Next due Prevnar20 age 89PSA 0.19 (negative, 11/2022)      06/07/2022    8:21 AM 05/31/2021    8:15 AM 11/30/2020    8:40 AM   Depression screen PHQ 2/9  Decreased Interest 0 0 0  Down, Depressed, Hopeless 0 0 0  PHQ - 2 Score 0 0 0  Altered sleeping 0 0   Tired, decreased energy 0 0   Change in appetite 0 0   Feeling bad or failure about yourself  0 0   Trouble concentrating 0 0   Moving slowly or fidgety/restless 0 0   Suicidal thoughts 0 0   PHQ-9 Score 0 0   Difficult doing work/chores Not difficult at all Not difficult at all     Social History   Tobacco Use   Smoking status: Former    Packs/day: 1.00    Years: 20.00    Total pack years: 20.00    Types: Cigarettes    Quit date: 09/08/1993    Years since quitting: 29.2   Smokeless tobacco: Former  Substance Use Topics   Alcohol use: No    Alcohol/week: 0.0 standard drinks of alcohol    Comment: former quit 14 years ago   Drug use: No    Comment: former quit 15 years ago    Review of Systems  Constitutional:  Negative for activity change, appetite change, chills, diaphoresis, fatigue and fever.  HENT:  Negative for congestion and hearing loss.   Eyes:  Negative for visual disturbance.  Respiratory:  Negative for cough, chest tightness, shortness of breath and wheezing.   Cardiovascular:  Negative for chest pain, palpitations and leg swelling.  Gastrointestinal:  Negative for abdominal pain, constipation, diarrhea, nausea and vomiting.  Genitourinary:  Negative for dysuria, frequency and hematuria.  Musculoskeletal:  Negative for arthralgias and neck pain.  Skin:  Negative for rash.  Neurological:  Negative for dizziness, weakness, light-headedness, numbness and headaches.  Hematological:  Negative for adenopathy.  Psychiatric/Behavioral:  Negative for behavioral problems, dysphoric mood and sleep disturbance.    Per HPI unless specifically indicated above     Objective:    BP 128/80   Pulse (!) 50   Ht _0  (1.803 m)   Wt 189 lb (85.7 kg)   SpO2 100%   BMI 26.36 kg/m   Wt Readings from Last 3 Encounters:  12/09/22 189 lb  (85.7 kg)  06/07/22 185 lb 9.6 oz (84.2 kg)  12/07/21 191 lb 9.6 oz (86.9 kg)    Physical Exam Vitals and nursing note reviewed.  Constitutional:      General: He is not in acute distress.    Appearance: He is well-developed. He is not diaphoretic.     Comments: Well-appearing, comfortable, cooperative  HENT:     Head: Normocephalic and atraumatic.  Eyes:     General:        Right eye: No discharge.        Left eye: No discharge.     Conjunctiva/sclera: Conjunctivae normal.     Pupils: Pupils are equal, round, and reactive to light.  Neck:     Thyroid: No thyromegaly.  Cardiovascular:     Rate and Rhythm: Normal rate and regular rhythm.     Pulses: Normal pulses.     Heart sounds: Normal heart sounds. No murmur heard. Pulmonary:     Effort: Pulmonary effort is normal. No respiratory distress.     Breath sounds: Normal breath sounds. No wheezing or rales.  Abdominal:     General: Bowel sounds are normal. There is no distension.     Palpations: Abdomen is soft. There is no mass.     Tenderness: There is no abdominal tenderness.  Musculoskeletal:        General: No tenderness. Normal range of motion.     Cervical back: Normal range of motion and neck supple.     Comments: Upper / Lower Extremities: - Normal muscle tone, strength bilateral upper extremities 5/5, lower extremities 5/5  Lymphadenopathy:     Cervical: No cervical adenopathy.  Skin:    General: Skin is warm and dry.     Findings: No erythema or rash.     Comments: Right foot inner greater toenail slight ingrown, not inflamed.  Neurological:     Mental Status: He is alert and oriented to person, place, and time.     Comments: Distal sensation intact to light touch all extremities  Psychiatric:        Mood and Affect: Mood normal.        Behavior: Behavior normal.        Thought Content: Thought content normal.     Comments: Well groomed, good eye contact, normal speech and thoughts      Results for orders  placed or performed in visit on 11/30/22  T4, free  Result Value Ref Range   Free T4 0.9 0.8 - 1.8 ng/dL  TSH  Result Value Ref Range   TSH 2.24 0.40 - 4.50 mIU/L  PSA  Result Value Ref Range   PSA 0.19 < OR = 4.00 ng/mL  Hemoglobin A1c  Result Value Ref Range   Hgb A1c MFr Bld 5.8 (H) <5.7 % of total Hgb   Mean Plasma Glucose  120 mg/dL   eAG (mmol/L) 6.6 mmol/L  Lipid panel  Result Value Ref Range   Cholesterol 148 <200 mg/dL   HDL 40 > OR = 40 mg/dL   Triglycerides 110 <150 mg/dL   LDL Cholesterol (Calc) 87 mg/dL (calc)   Total CHOL/HDL Ratio 3.7 <5.0 (calc)   Non-HDL Cholesterol (Calc) 108 <130 mg/dL (calc)  CBC with Differential/Platelet  Result Value Ref Range   WBC 3.9 3.8 - 10.8 Thousand/uL   RBC 4.94 4.20 - 5.80 Million/uL   Hemoglobin 15.2 13.2 - 17.1 g/dL   HCT 43.5 38.5 - 50.0 %   MCV 88.1 80.0 - 100.0 fL   MCH 30.8 27.0 - 33.0 pg   MCHC 34.9 32.0 - 36.0 g/dL   RDW 13.2 11.0 - 15.0 %   Platelets 104 (L) 140 - 400 Thousand/uL   MPV 10.7 7.5 - 12.5 fL   Neutro Abs 2,044 1,500 - 7,800 cells/uL   Lymphs Abs 1,377 850 - 3,900 cells/uL   Absolute Monocytes 367 200 - 950 cells/uL   Eosinophils Absolute 82 15 - 500 cells/uL   Basophils Absolute 31 0 - 200 cells/uL   Neutrophils Relative % 52.4 %   Total Lymphocyte 35.3 %   Monocytes Relative 9.4 %   Eosinophils Relative 2.1 %   Basophils Relative 0.8 %  COMPLETE METABOLIC PANEL WITH GFR  Result Value Ref Range   Glucose, Bld 146 (H) 65 - 99 mg/dL   BUN 16 7 - 25 mg/dL   Creat 0.83 0.70 - 1.35 mg/dL   eGFR 98 > OR = 60 mL/min/1.39m   BUN/Creatinine Ratio SEE NOTE: 6 - 22 (calc)   Sodium 143 135 - 146 mmol/L   Potassium 4.1 3.5 - 5.3 mmol/L   Chloride 108 98 - 110 mmol/L   CO2 27 20 - 32 mmol/L   Calcium 10.0 8.6 - 10.3 mg/dL   Total Protein 6.7 6.1 - 8.1 g/dL   Albumin 4.6 3.6 - 5.1 g/dL   Globulin 2.1 1.9 - 3.7 g/dL (calc)   AG Ratio 2.2 1.0 - 2.5 (calc)   Total Bilirubin 1.6 (H) 0.2 - 1.2 mg/dL    Alkaline phosphatase (APISO) 59 35 - 144 U/L   AST 20 10 - 35 U/L   ALT 13 9 - 46 U/L      Assessment & Plan:   Problem List Items Addressed This Visit     Esophageal varices in cirrhosis (HCC)   Relevant Medications   losartan (COZAAR) 25 MG tablet   nadolol (CORGARD) 20 MG tablet   Portal hypertension (HCC)   Relevant Medications   losartan (COZAAR) 25 MG tablet   nadolol (CORGARD) 20 MG tablet   Prediabetes   Thrombocytopenia (HCC)   Other Visit Diagnoses     Annual physical exam    -  Primary   Needs flu shot       Relevant Orders   Flu Vaccine QUAD 665moM (Fluarix, Fluzone & Alfiuria Quad PF) (Completed)       Updated Health Maintenance information Reviewed recent lab results with patient Encouraged improvement to lifestyle with diet and exercise Goal of weight loss   Meds ordered this encounter  Medications   losartan (COZAAR) 25 MG tablet    Sig: Take 1 tablet (25 mg total) by mouth daily.    Dispense:  90 tablet    Refill:  3    Add refills. DX Code Needed  K76.6   nadolol (CORGARD) 20 MG tablet  Sig: Take 0.5 tablets (10 mg total) by mouth every evening.    Dispense:  45 tablet    Refill:  3    Add refills DX Code Needed  K76.6    Follow up plan: Return in about 6 months (around 06/10/2023), or if symptoms worsen or fail to improve, for 6 month PreDM A1c.   repeat 6 month A1c  [name], DO [facility] 12/09/2022, 10:01 AM

## 2022-12-09 NOTE — Assessment & Plan Note (Signed)
Secondary to cirrhosis Stable mild low plt Followed by hepatology

## 2023-06-08 ENCOUNTER — Ambulatory Visit: Payer: 59 | Admitting: Family Medicine

## 2023-06-08 ENCOUNTER — Other Ambulatory Visit: Payer: Self-pay | Admitting: Family Medicine

## 2023-06-08 ENCOUNTER — Encounter: Payer: Self-pay | Admitting: Family Medicine

## 2023-06-08 VITALS — BP 152/70 | HR 43 | Temp 98.4°F | Resp 17 | Ht 71.0 in | Wt 185.0 lb

## 2023-06-08 DIAGNOSIS — L989 Disorder of the skin and subcutaneous tissue, unspecified: Secondary | ICD-10-CM | POA: Diagnosis not present

## 2023-06-08 DIAGNOSIS — Z125 Encounter for screening for malignant neoplasm of prostate: Secondary | ICD-10-CM

## 2023-06-08 DIAGNOSIS — R7303 Prediabetes: Secondary | ICD-10-CM | POA: Diagnosis not present

## 2023-06-08 DIAGNOSIS — D485 Neoplasm of uncertain behavior of skin: Secondary | ICD-10-CM

## 2023-06-08 DIAGNOSIS — K766 Portal hypertension: Secondary | ICD-10-CM

## 2023-06-08 DIAGNOSIS — B1920 Unspecified viral hepatitis C without hepatic coma: Secondary | ICD-10-CM | POA: Diagnosis not present

## 2023-06-08 DIAGNOSIS — Z0000 Encounter for general adult medical examination without abnormal findings: Secondary | ICD-10-CM

## 2023-06-08 DIAGNOSIS — I8510 Secondary esophageal varices without bleeding: Secondary | ICD-10-CM | POA: Diagnosis not present

## 2023-06-08 DIAGNOSIS — K7460 Unspecified cirrhosis of liver: Secondary | ICD-10-CM

## 2023-06-08 DIAGNOSIS — K7469 Other cirrhosis of liver: Secondary | ICD-10-CM

## 2023-06-08 DIAGNOSIS — E782 Mixed hyperlipidemia: Secondary | ICD-10-CM

## 2023-06-08 DIAGNOSIS — R7989 Other specified abnormal findings of blood chemistry: Secondary | ICD-10-CM

## 2023-06-08 LAB — POCT GLYCOSYLATED HEMOGLOBIN (HGB A1C): Hemoglobin A1C: 6 % — AB (ref 4.0–5.6)

## 2023-06-08 NOTE — Progress Notes (Signed)
Subjective:    Patient ID: Jesus Mejia, male    DOB: 11-Mar-1956, 67 y.o.   MRN: [id number]  Jesus Mejia is a 67 y.o. male presenting on 06/08/2023 for Prediabetes   HPI  Specialist: [facility]   Portal Hypertension PMH Hepatic Cirrhosis due to Chronic Hep C, Esophageal Varices, Portal HTN Thrombocytopenia Followed by [facility] History of varices and tachycardia  Elevated BP Current Meds - Losartan 25mg  daily, Nadolol 20mg  (half tablet evening) needs refills Reports good compliance, took meds today. Tolerating well, w/o complaints   Hepatic Cirrhosis due to Chronic Hep C (treated s/p Harvoni) / Complicated by Esophageal Varices, Portal HTN - Background history with prior history of chronic Hep C infection diagnosed in 2012 with complicated cirrhosis, he was treated with Harvoni by 2015 with complete resolution of HCV infection by report and chart review - Followed by [facility] Dr Georgina Snell.  Pre-Diabetes Prior history years ago with PreDM Today result A1c 6.0, prior 5.8 some increased he admits less focus on this CBGs: Not checking Meds: None Lifestyle: - Diet (goal to resume and improve low carb diet) - Exercise (Goal to resume outside activity, beekeeper)   Left Ear, non healing lesion On top of left ear helix with dry scaly area with scab Present >1 year Possible scratch or bite, but has not healed well.      06/08/2023    8:58 AM 06/07/2022    8:21 AM 05/31/2021    8:15 AM  Depression screen PHQ 2/9  Decreased Interest 0 0 0  Down, Depressed, Hopeless 0 0 0  PHQ - 2 Score 0 0 0  Altered sleeping 1 0 0  Tired, decreased energy 0 0 0  Change in appetite 0 0 0  Feeling bad or failure about yourself  0 0 0  Trouble concentrating 0 0 0  Moving slowly or fidgety/restless 0 0 0  Suicidal thoughts 0 0 0  PHQ-9 Score 1 0 0  Difficult doing work/chores Not difficult at all Not difficult at all Not difficult at  all    Social History   Tobacco Use   Smoking status: Former    Packs/day: 1.00    Years: 20.00    Additional pack years: 0.00    Total pack years: 20.00    Types: Cigarettes    Quit date: 09/08/1993    Years since quitting: 29.7   Smokeless tobacco: Former  Building services engineer Use: Never used  Substance Use Topics   Alcohol use: No    Alcohol/week: 0.0 standard drinks of alcohol    Comment: former quit 14 years ago   Drug use: No    Comment: former quit 15 years ago    Review of Systems Per HPI unless specifically indicated above     Objective:    BP (!) 152/70 (BP Location: Left Arm, Cuff Size: Normal)   Pulse (!) 43   Temp 98.4 F (36.9 C) (Oral)   Resp 17   Ht 5\' 11"  (1.803 m)   Wt 185 lb (83.9 kg)   SpO2 100%   BMI 25.80 kg/m   Wt Readings from Last 3 Encounters:  06/08/23 185 lb (83.9 kg)  12/09/22 189 lb (85.7 kg)  06/07/22 185 lb 9.6 oz (84.2 kg)    Physical Exam Vitals and nursing note reviewed.  Constitutional:      General: He is not in acute distress.    Appearance: Normal appearance. He is  well-developed. He is not diaphoretic.     Comments: Well-appearing, comfortable, cooperative  HENT:     Head: Normocephalic and atraumatic.  Eyes:     General:        Right eye: No discharge.        Left eye: No discharge.     Conjunctiva/sclera: Conjunctivae normal.  Neck:     Thyroid: No thyromegaly.  Cardiovascular:     Rate and Rhythm: Normal rate and regular rhythm.     Pulses: Normal pulses.     Heart sounds: Normal heart sounds. No murmur heard. Pulmonary:     Effort: Pulmonary effort is normal. No respiratory distress.     Breath sounds: Normal breath sounds. No wheezing or rales.  Musculoskeletal:        General: Normal range of motion.     Cervical back: Normal range of motion and neck supple.  Lymphadenopathy:     Cervical: No cervical adenopathy.  Skin:    General: Skin is warm and dry.     Findings: Lesion (superior Left ear helix  0.5 cm scab non healing) present. No erythema or rash.  Neurological:     Mental Status: He is alert and oriented to person, place, and time. Mental status is at baseline.  Psychiatric:        Mood and Affect: Mood normal.        Behavior: Behavior normal.        Thought Content: Thought content normal.     Comments: Well groomed, good eye contact, normal speech and thoughts      Results for orders placed or performed in visit on 06/08/23  POCT glycosylated hemoglobin (Hb A1C)  Result Value Ref Range   Hemoglobin A1C 6.0 (A) 4.0 - 5.6 %      Assessment & Plan:   Problem List Items Addressed This Visit     Compensated cirrhosis related to hepatitis C virus (HCV) (HCC)    Stable, without recent changes, remains compensated. Complicated by esophageal varices and portal HTN. Continues on beta blocker Nadolol and ARB low dose. Followed by [facility] for surveillance      Esophageal varices in cirrhosis (HCC)    Stable, without complication. Continues on beta blocker Nadolol. Followed by [facility] for surveillance      Portal hypertension (HCC)    Elevated BP Overall controlled, asymptomatic Otherwise stable, without complication Continues on beta blocker Nadolol Followed by [facility] for surveillance  Plan: Dose increase Losartan from 25 to 50mg  daily Continue Nadolol half dose 20 > 10mg  daily      Prediabetes - Primary    Mild elevated A1c to 6.0, prior 5.8 range Emphasis diet changes. Continue to improve lifestyle maintain A1c No medication at this time Repeat 6 month      Relevant Orders   POCT glycosylated hemoglobin (Hb A1C) (Completed)   Other Visit Diagnoses     Non-healing skin lesion       Relevant Orders   Ambulatory referral to Dermatology   Neoplasm of uncertain behavior of ear       Relevant Orders   Ambulatory referral to Dermatology       referral to Dermatology for evaluation of non healing skin lesion left ear superior helix, dry irritated area  AK most likely >1 year with progression to recurrent ulceration scab     Orders Placed This Encounter  Procedures   Ambulatory referral to Dermatology    Referral Priority:   Routine    Referral  Type:   Consultation    Referral Reason:   Specialty Services Required    Requested Specialty:   Dermatology    Number of Visits Requested:   1   POCT glycosylated hemoglobin (Hb A1C)     No orders of the defined types were placed in this encounter.    Follow up plan: Return in about 6 months (around 12/08/2023) for 6 month fasting lab only then 1 week later Annual Physical.  Future labs ordered for 12/04/23   Saralyn Pilar, DO [facility] 06/08/2023, 9:04 AM

## 2023-06-08 NOTE — Assessment & Plan Note (Signed)
Stable, without complication. Continues on beta blocker Nadolol. Followed by [facility] for surveillance 

## 2023-06-08 NOTE — Assessment & Plan Note (Signed)
Stable, without recent changes, remains compensated. Complicated by esophageal varices and portal HTN. Continues on beta blocker Nadolol and ARB low dose. Followed by [facility] for surveillance 

## 2023-06-08 NOTE — Patient Instructions (Addendum)
Thank you for coming to the office today.  Goal top number to be < 140 and bottom number < 90  For 1 week - take Losartan 25mg  EVERY DAY Check BP readings  If still elevated, then double your dose, take TWO of the 25mg  back to back = 50mg  Monitor BP  Let me know once you have measured it for another 1-2 weeks.  If this dose works well, I can re order for you. If still elevated, let me know.   Recent Labs    12/01/22 0811 06/08/23 0906  HGBA1C 5.8* 6.0*     DUE for FASTING BLOOD WORK (no food or drink after midnight before the lab appointment, only water or coffee without cream/sugar on the morning of)  SCHEDULE "Lab Only" visit in the morning at the clinic for lab draw in 6 MONTHS   - Make sure Lab Only appointment is at about 1 week before your next appointment, so that results will be available  For Lab Results, once available within 2-3 days of blood draw, you can can log in to MyChart online to view your results and a brief explanation. Also, we can discuss results at next follow-up visit.   Please schedule a Follow-up Appointment to: Return in about 6 months (around 12/08/2023) for 6 month fasting lab only then 1 week later Annual Physical.  If you have any other questions or concerns, please feel free to call the office or send a message through MyChart. You may also schedule an earlier appointment if necessary.  Additionally, you may be receiving a survey about your experience at our office within a few days to 1 week by e-mail or mail. We value your feedback.  Saralyn Pilar, DO [facility], [place]

## 2023-06-08 NOTE — Assessment & Plan Note (Signed)
Mild elevated A1c to 6.0, prior 5.8 range Emphasis diet changes. Continue to improve lifestyle maintain A1c No medication at this time Repeat 6 month

## 2023-06-08 NOTE — Assessment & Plan Note (Signed)
Elevated BP Overall controlled, asymptomatic Otherwise stable, without complication Continues on beta blocker Nadolol Followed by [facility] for surveillance  Plan: Dose increase Losartan from 25 to 50mg  daily Continue Nadolol half dose 20 > 10mg  daily

## 2023-12-04 ENCOUNTER — Other Ambulatory Visit: Payer: Medicare Other

## 2023-12-04 DIAGNOSIS — Z0000 Encounter for general adult medical examination without abnormal findings: Secondary | ICD-10-CM

## 2023-12-04 DIAGNOSIS — K766 Portal hypertension: Secondary | ICD-10-CM

## 2023-12-04 DIAGNOSIS — E782 Mixed hyperlipidemia: Secondary | ICD-10-CM

## 2023-12-04 DIAGNOSIS — R7303 Prediabetes: Secondary | ICD-10-CM

## 2023-12-04 DIAGNOSIS — R7989 Other specified abnormal findings of blood chemistry: Secondary | ICD-10-CM

## 2023-12-04 DIAGNOSIS — Z125 Encounter for screening for malignant neoplasm of prostate: Secondary | ICD-10-CM

## 2023-12-05 LAB — LIPID PANEL
Cholesterol: 147 mg/dL (ref <200)
HDL: 44 mg/dL (ref >=40)
LDL Cholesterol (Calc): 88 mg/dL
Non-HDL Cholesterol (Calc): 103 mg/dL (ref <130)
Total CHOL/HDL Ratio: 3.3 (calc) (ref <5.0)
Triglycerides: 64 mg/dL (ref <150)

## 2023-12-05 LAB — COMPLETE METABOLIC PANEL WITH GFR
AG Ratio: 2.3 (calc) (ref 1.0–2.5)
ALT: 10 U/L (ref 9–46)
AST: 18 U/L (ref 10–35)
Albumin: 4.3 g/dL (ref 3.6–5.1)
Alkaline phosphatase (APISO): 59 U/L (ref 35–144)
BUN/Creatinine Ratio: 17 (calc) (ref 6–22)
BUN: 11 mg/dL (ref 7–25)
CO2: 29 mmol/L (ref 20–32)
Calcium: 9.5 mg/dL (ref 8.6–10.3)
Chloride: 107 mmol/L (ref 98–110)
Creat: 0.65 mg/dL — ABNORMAL LOW (ref 0.70–1.35)
Globulin: 1.9 g/dL (ref 1.9–3.7)
Glucose, Bld: 140 mg/dL — ABNORMAL HIGH (ref 65–99)
Potassium: 4 mmol/L (ref 3.5–5.3)
Sodium: 142 mmol/L (ref 135–146)
Total Bilirubin: 1.5 mg/dL — ABNORMAL HIGH (ref 0.2–1.2)
Total Protein: 6.2 g/dL (ref 6.1–8.1)
eGFR: 105 mL/min/{1.73_m2} (ref >=60)

## 2023-12-05 LAB — TSH: TSH: 2.41 m[IU]/L (ref 0.40–4.50)

## 2023-12-05 LAB — CBC WITH DIFFERENTIAL/PLATELET
Absolute Lymphocytes: 1092 {cells}/uL (ref 850–3900)
Absolute Monocytes: 319 {cells}/uL (ref 200–950)
Basophils Absolute: 28 {cells}/uL (ref 0–200)
Basophils Relative: 0.8 %
Eosinophils Absolute: 88 {cells}/uL (ref 15–500)
Eosinophils Relative: 2.5 %
HCT: 40.7 % (ref 38.5–50.0)
Hemoglobin: 13.9 g/dL (ref 13.2–17.1)
MCH: 30.6 pg (ref 27.0–33.0)
MCHC: 34.2 g/dL (ref 32.0–36.0)
MCV: 89.6 fL (ref 80.0–100.0)
MPV: 11.3 fL (ref 7.5–12.5)
Monocytes Relative: 9.1 %
Neutro Abs: 1974 {cells}/uL (ref 1500–7800)
Neutrophils Relative %: 56.4 %
Platelets: 119 10*3/uL — ABNORMAL LOW (ref 140–400)
RBC: 4.54 10*6/uL (ref 4.20–5.80)
RDW: 12.8 % (ref 11.0–15.0)
Total Lymphocyte: 31.2 %
WBC: 3.5 10*3/uL — ABNORMAL LOW (ref 3.8–10.8)

## 2023-12-05 LAB — T4, FREE: Free T4: 0.7 ng/dL — ABNORMAL LOW (ref 0.8–1.8)

## 2023-12-05 LAB — PSA: PSA: 0.19 ng/mL (ref <=4.00)

## 2023-12-05 LAB — HEMOGLOBIN A1C
Hgb A1c MFr Bld: 5.4 %{Hb} (ref <5.7)
Mean Plasma Glucose: 108 mg/dL
eAG (mmol/L): 6 mmol/L

## 2023-12-12 ENCOUNTER — Encounter: Payer: Self-pay | Admitting: Family Medicine

## 2023-12-12 ENCOUNTER — Ambulatory Visit (INDEPENDENT_AMBULATORY_CARE_PROVIDER_SITE_OTHER): Payer: Medicare Other | Admitting: Family Medicine

## 2023-12-12 VITALS — BP 132/80 | HR 46 | Ht 71.0 in | Wt 169.0 lb

## 2023-12-12 DIAGNOSIS — Z0000 Encounter for general adult medical examination without abnormal findings: Secondary | ICD-10-CM

## 2023-12-12 DIAGNOSIS — K7460 Unspecified cirrhosis of liver: Secondary | ICD-10-CM | POA: Diagnosis not present

## 2023-12-12 DIAGNOSIS — K7469 Other cirrhosis of liver: Secondary | ICD-10-CM

## 2023-12-12 DIAGNOSIS — K766 Portal hypertension: Secondary | ICD-10-CM

## 2023-12-12 DIAGNOSIS — R7303 Prediabetes: Secondary | ICD-10-CM

## 2023-12-12 DIAGNOSIS — R7989 Other specified abnormal findings of blood chemistry: Secondary | ICD-10-CM

## 2023-12-12 DIAGNOSIS — Z23 Encounter for immunization: Secondary | ICD-10-CM

## 2023-12-12 DIAGNOSIS — E782 Mixed hyperlipidemia: Secondary | ICD-10-CM

## 2023-12-12 DIAGNOSIS — B182 Chronic viral hepatitis C: Secondary | ICD-10-CM

## 2023-12-12 DIAGNOSIS — I8510 Secondary esophageal varices without bleeding: Secondary | ICD-10-CM

## 2023-12-12 DIAGNOSIS — B1920 Unspecified viral hepatitis C without hepatic coma: Secondary | ICD-10-CM

## 2023-12-12 MED ORDER — LOSARTAN POTASSIUM 25 MG PO TABS: 25.0000 mg | ORAL_TABLET | Freq: Every day | ORAL | 3 refills | Status: DC

## 2023-12-12 MED ORDER — NADOLOL 20 MG PO TABS: 10.0000 mg | ORAL_TABLET | Freq: Every evening | ORAL | 3 refills | Status: DC

## 2023-12-12 NOTE — Assessment & Plan Note (Addendum)
A1c 5.4 improved Emphasis diet changes. Continue to improve lifestyle maintain A1c No medication at this time Repeat 12 month

## 2023-12-12 NOTE — Assessment & Plan Note (Signed)
BP improved Overall controlled, asymptomatic Otherwise stable, without complication Continues on beta blocker Nadolol Followed by [facility] for surveillance  Plan: Continue Losartan 25mg  daily, Nadaolol 20mg  daily

## 2023-12-12 NOTE — Assessment & Plan Note (Signed)
Controlled cholesterol on lifestyle Last lipid panel 11/2023 The 10-year ASCVD risk score (Arnett DK, et al., 2019) is: 11.7%   Plan: 1. Defer statin, declined and patient has cirrhosis 2. Encourage improved lifestyle - low carb/cholesterol, reduce portion size, continue improving regular exercise

## 2023-12-12 NOTE — Patient Instructions (Addendum)
Thank you for coming to the office today.  Pneumonia - Prevnar-20 at the pharmacy or here.  For the low back try muscle rub, voltare, icy hot, heating pad, stretching.  Let me know if worse we can do X-ray of low back  Labs look good today  Recent Labs    06/08/23 0906 12/04/23 0824  HGBA1C 6.0* 5.4   Mild low Thyroid result, but not concerning to require treatment.  Meds refilled 1 year  DUE for FASTING BLOOD WORK (no food or drink after midnight before the lab appointment, only water or coffee without cream/sugar on the morning of)  SCHEDULE "Lab Only" visit in the morning at the clinic for lab draw in 1 YEAR  - Make sure Lab Only appointment is at about 1 week before your next appointment, so that results will be available  For Lab Results, once available within 2-3 days of blood draw, you can can log in to MyChart online to view your results and a brief explanation. Also, we can discuss results at next follow-up visit.    Please schedule a Follow-up Appointment to: Return for 1 year fasting lab > 1 week later Annual Physical.  If you have any other questions or concerns, please feel free to call the office or send a message through MyChart. You may also schedule an earlier appointment if necessary.  Additionally, you may be receiving a survey about your experience at our office within a few days to 1 week by e-mail or mail. We value your feedback.  Saralyn Pilar, DO [facility], [place]

## 2023-12-12 NOTE — Assessment & Plan Note (Signed)
Stable, without complication. Continues on beta blocker Nadolol. Followed by [facility] for surveillance 

## 2023-12-12 NOTE — Assessment & Plan Note (Signed)
Stable, without recent changes, remains compensated. Complicated by esophageal varices and portal HTN. Continues on beta blocker Nadolol and ARB low dose. Followed by [facility] for surveillance 

## 2023-12-12 NOTE — Progress Notes (Signed)
Subjective:    Patient ID: Jesus Mejia, male    DOB: 11-16-1956, 67 y.o.   MRN: [id number]  [name] is a 67 y.o. male presenting on 12/12/2023 for Medical Management of Chronic Issues and Hypertension   HPI  Discussed the use of AI scribe software for clinical note transcription with the patient, who gave verbal consent to proceed.   The patient, with a history of prostate cancer, liver disease, and diabetes, presents for an annual check-up. He reports no new health issues since the last visit.  The patient's recent lab results indicate a stable PSA level of 0.19, normal kidney and liver function, and fluctuating blood counts, which are expected given his medical history. His cholesterol levels are excellent, and his A1c has improved to 5.4, indicating a return to a normal range from a previous prediabetic state. The patient attributes this improvement to weight loss and dietary changes.   Specialist: [facility]   Portal Hypertension PMH Hepatic Cirrhosis due to Chronic Hep C, Esophageal Varices, Portal HTN Thrombocytopenia Followed by [facility] History of varices and tachycardia   Elevated BP Current Meds - Losartan 25mg  daily, Nadolol 20mg  (half tablet evening) needs refills Reports good compliance, took meds today. Tolerating well, w/o complaints   Hepatic Cirrhosis due to Chronic Hep C (treated s/p Harvoni) / Complicated by Esophageal Varices, Portal HTN - Background history with prior history of chronic Hep C infection diagnosed in 2012 with complicated cirrhosis, he was treated with Harvoni by 2015 with complete resolution of HCV infection by report and chart review - Followed by [facility] Dr Georgina Snell.   Pre-Diabetes Prior history years ago with PreDM Today result A1c 5.4, prior 5.8 to 6.0 CBGs: Not checking Meds: None Lifestyle: - Diet (goal to resume and improve low carb diet) - Exercise (Goal to resume  outside activity, beekeeper)   TSH Elevated Mild low Free T4 0.7 but normal TSH Not requiring treatment  Low Back Pain, positional The patient also reports experiencing lower back pain upon waking, particularly when he has been sleeping on his side. The pain subsides after a few minutes of lying on his back. This issue has been ongoing for a couple of months. The patient does not report any other new symptoms or health concerns.       PMH - Deafness Right Ear    Health Maintenance:   Flu Shot today  Future Prevnar-20 next pneumonia vaccine    Screening PSA 0.19 (negative, 11/2023)     12/12/2023    8:33 AM 06/08/2023    8:58 AM 06/07/2022    8:21 AM  Depression screen PHQ 2/9  Decreased Interest 0 0 0  Down, Depressed, Hopeless 0 0 0  PHQ - 2 Score 0 0 0  Altered sleeping  1 0  Tired, decreased energy  0 0  Change in appetite  0 0  Feeling bad or failure about yourself   0 0  Trouble concentrating  0 0  Moving slowly or fidgety/restless  0 0  Suicidal thoughts  0 0  PHQ-9 Score  1 0  Difficult doing work/chores  Not difficult at all Not difficult at all       12/12/2023    8:33 AM 06/08/2023    8:58 AM 06/07/2022    8:21 AM 05/31/2021    8:15 AM  GAD 7 : Generalized Anxiety Score  Nervous, Anxious, on Edge 0 0 1 0  Control/stop worrying 0 0  1 0  Worry too much - different things 0 0 1 0  Trouble relaxing 0 0 1 0  Restless 0 0 0 0  Easily annoyed or irritable 0 0 1 0  Afraid - awful might happen 0 0 0 0  Total GAD 7 Score 0 0 5 0  Anxiety Difficulty  Not difficult at all Not difficult at all Not difficult at all     Past Medical History:  Diagnosis Date   Acute hepatitis C    Bleeding nose    Chronic fatigue    Cirrhosis, non-alcoholic (HCC)    Elevated serum glucose    Past Surgical History:  Procedure Laterality Date   HERNIA REPAIR     Social History   Socioeconomic History   Marital status: Married    Spouse name: Not on file   Number of  children: Not on file   Years of education: Not on file   Highest education level: Not on file  Occupational History   Occupation: retired  Tobacco Use   Smoking status: Former    Current packs/day: 0.00    Average packs/day: 1 pack/day for 20.0 years (20.0 ttl pk-yrs)    Types: Cigarettes    Start date: 09/08/1973    Quit date: 09/08/1993    Years since quitting: 30.2   Smokeless tobacco: Former  Building services engineer status: Never Used  Substance and Sexual Activity   Alcohol use: No    Alcohol/week: 0.0 standard drinks of alcohol    Comment: former quit 14 years ago   Drug use: No    Comment: former quit 15 years ago   Sexual activity: Not on file  Other Topics Concern   Not on file  Social History Narrative   Not on file   Social Drivers of Health   Financial Resource Strain: Low Risk  (06/06/2019)   Received from [facility], [facility]   Overall Financial Resource Strain (CARDIA)    Difficulty of Paying Living Expenses: Not very hard  Food Insecurity: No Food Insecurity (06/06/2019)   Received from [facility], [facility]   Hunger Vital Sign    Worried About Running Out of Food in the Last Year: Never true    Ran Out of Food in the Last Year: Never true  Transportation Needs: No Transportation Needs (06/06/2019)   Received from [facility], [facility]   PRAPARE - Transportation    In the past 12 months, has lack of transportation kept you from medical appointments or from getting medications?: No    Lack of Transportation (Non-Medical): No  Physical Activity: Insufficiently Active (06/06/2019)   Received from [facility], [facility]   Exercise Vital Sign    Days of Exercise per Week: 4 days    Minutes of Exercise per Session: 20 min  Stress: No Stress Concern Present (06/06/2019)   Received from [facility], [facility]   [employer] of Occupational Health - Occupational Stress Questionnaire    Feeling of Stress : Only a little  Social Connections: Unknown (06/06/2019)   Received from [facility], [facility]   Social Connection and Isolation Panel [NHANES]    Frequency of Communication with Friends and Family: Not on file    Frequency of Social Gatherings with Friends and Family: Not on file    Attends Religious  Services: Not on file    Active Member of Clubs or Organizations: Not on file    Attends Banker Meetings: Not on file    Marital Status: Married  Intimate Partner Violence: Not on file   Family History  Problem Relation Age of Onset   Cancer Mother        breast   Cancer Sister        breast   Hypertension Brother    Prostate cancer Neg Hx    Current Outpatient Medications on File Prior to Visit  Medication Sig   calcium & magnesium carbonates (MYLANTA) 311-232 MG per tablet Take 1 tablet by mouth daily as needed.  (Patient not taking: Reported on 12/12/2023)   Magnesium 250 MG TABS Take by mouth. As needed (Patient not taking: Reported on 12/12/2023)   Methylsulfonylmethane 1000 MG CAPS Take 1 capsule by mouth daily as needed. (Patient not taking: Reported on 12/12/2023)   Multiple Vitamin (MULTIVITAMIN WITH MINERALS) TABS tablet Take 1 tablet by mouth daily. (Patient not taking: Reported on 12/12/2023)   No current facility-administered medications on file prior to visit.    Review of Systems  Constitutional:  Negative for activity change, appetite change, chills, diaphoresis, fatigue and fever.  HENT:  Negative for congestion and hearing loss.   Eyes:  Negative for visual disturbance.  Respiratory:  Negative for cough, chest tightness, shortness of breath and wheezing.   Cardiovascular:  Negative for chest pain, palpitations and leg swelling.  Gastrointestinal:  Negative for abdominal pain,  constipation, diarrhea, nausea and vomiting.  Genitourinary:  Negative for dysuria, frequency and hematuria.  Musculoskeletal:  Negative for arthralgias and neck pain.  Skin:  Negative for rash.  Neurological:  Negative for dizziness, weakness, light-headedness, numbness and headaches.  Hematological:  Negative for adenopathy.  Psychiatric/Behavioral:  Negative for behavioral problems, dysphoric mood and sleep disturbance.    Per HPI unless specifically indicated above     Objective:    BP 132/80   Pulse (!) 46   Ht 5\' 11"  (1.803 m)   Wt 169 lb (76.7 kg)   BMI 23.57 kg/m   Wt Readings from Last 3 Encounters:  12/12/23 169 lb (76.7 kg)  06/08/23 185 lb (83.9 kg)  12/09/22 189 lb (85.7 kg)    Physical Exam Vitals and nursing note reviewed.  Constitutional:      General: He is not in acute distress.    Appearance: He is well-developed. He is not diaphoretic.     Comments: Well-appearing, comfortable, cooperative  HENT:     Head: Normocephalic and atraumatic.  Eyes:     General:        Right eye: No discharge.        Left eye: No discharge.     Conjunctiva/sclera: Conjunctivae normal.     Pupils: Pupils are equal, round, and reactive to light.  Neck:     Thyroid: No thyromegaly.  Cardiovascular:     Rate and Rhythm: Normal rate and regular rhythm.     Pulses: Normal pulses.     Heart sounds: Normal heart sounds. No murmur heard. Pulmonary:     Effort: Pulmonary effort is normal. No respiratory distress.     Breath sounds: Normal breath sounds. No wheezing or rales.  Abdominal:     General: Bowel sounds are normal. There is no distension.     Palpations: Abdomen is soft. There is no mass.     Tenderness: There is no abdominal tenderness.  Musculoskeletal:  General: No tenderness. Normal range of motion.     Cervical back: Normal range of motion and neck supple.     Right lower leg: No edema.     Left lower leg: No edema.     Comments: Upper / Lower  Extremities: - Normal muscle tone, strength bilateral upper extremities 5/5, lower extremities 5/5  Lymphadenopathy:     Cervical: No cervical adenopathy.  Skin:    General: Skin is warm and dry.     Findings: No erythema or rash.  Neurological:     Mental Status: He is alert and oriented to person, place, and time.     Comments: Distal sensation intact to light touch all extremities  Psychiatric:        Mood and Affect: Mood normal.        Behavior: Behavior normal.        Thought Content: Thought content normal.     Comments: Well groomed, good eye contact, normal speech and thoughts     Results for orders placed or performed in visit on 12/04/23  T4, free   Collection Time: 12/04/23  8:24 AM  Result Value Ref Range   Free T4 0.7 (L) 0.8 - 1.8 ng/dL  TSH   Collection Time: 12/04/23  8:24 AM  Result Value Ref Range   TSH 2.41 0.40 - 4.50 mIU/L  PSA   Collection Time: 12/04/23  8:24 AM  Result Value Ref Range   PSA 0.19 < OR = 4.00 ng/mL  Hemoglobin A1c   Collection Time: 12/04/23  8:24 AM  Result Value Ref Range   Hgb A1c MFr Bld 5.4 <5.7 % of total Hgb   Mean Plasma Glucose 108 mg/dL   eAG (mmol/L) 6.0 mmol/L  Lipid panel   Collection Time: 12/04/23  8:24 AM  Result Value Ref Range   Cholesterol 147 <200 mg/dL   HDL 44 > OR = 40 mg/dL   Triglycerides 64 <161 mg/dL   LDL Cholesterol (Calc) 88 mg/dL (calc)   Total CHOL/HDL Ratio 3.3 <5.0 (calc)   Non-HDL Cholesterol (Calc) 103 <130 mg/dL (calc)  CBC with Differential/Platelet   Collection Time: 12/04/23  8:24 AM  Result Value Ref Range   WBC 3.5 (L) 3.8 - 10.8 Thousand/uL   RBC 4.54 4.20 - 5.80 Million/uL   Hemoglobin 13.9 13.2 - 17.1 g/dL   HCT 09.6 04.5 - 40.9 %   MCV 89.6 80.0 - 100.0 fL   MCH 30.6 27.0 - 33.0 pg   MCHC 34.2 32.0 - 36.0 g/dL   RDW 81.1 91.4 - 78.2 %   Platelets 119 (L) 140 - 400 Thousand/uL   MPV 11.3 7.5 - 12.5 fL   Neutro Abs 1,974 1,500 - 7,800 cells/uL   Absolute Lymphocytes 1,092  850 - 3,900 cells/uL   Absolute Monocytes 319 200 - 950 cells/uL   Eosinophils Absolute 88 15 - 500 cells/uL   Basophils Absolute 28 0 - 200 cells/uL   Neutrophils Relative % 56.4 %   Total Lymphocyte 31.2 %   Monocytes Relative 9.1 %   Eosinophils Relative 2.5 %   Basophils Relative 0.8 %  COMPLETE METABOLIC PANEL WITH GFR   Collection Time: 12/04/23  8:24 AM  Result Value Ref Range   Glucose, Bld 140 (H) 65 - 99 mg/dL   BUN 11 7 - 25 mg/dL   Creat 9.56 (L) 2.13 - 1.35 mg/dL   eGFR 086 > OR = 60 VH/QIO/9.62X5   BUN/Creatinine Ratio 17 6 - 22 (  calc)   Sodium 142 135 - 146 mmol/L   Potassium 4.0 3.5 - 5.3 mmol/L   Chloride 107 98 - 110 mmol/L   CO2 29 20 - 32 mmol/L   Calcium 9.5 8.6 - 10.3 mg/dL   Total Protein 6.2 6.1 - 8.1 g/dL   Albumin 4.3 3.6 - 5.1 g/dL   Globulin 1.9 1.9 - 3.7 g/dL (calc)   AG Ratio 2.3 1.0 - 2.5 (calc)   Total Bilirubin 1.5 (H) 0.2 - 1.2 mg/dL   Alkaline phosphatase (APISO) 59 35 - 144 U/L   AST 18 10 - 35 U/L   ALT 10 9 - 46 U/L      Assessment & Plan:   Problem List Items Addressed This Visit     Compensated cirrhosis related to hepatitis C virus (HCV) (HCC)   Stable, without recent changes, remains compensated. Complicated by esophageal varices and portal HTN. Continues on beta blocker Nadolol and ARB low dose. Followed by [facility] for surveillance      Esophageal varices in cirrhosis (HCC)   Stable, without complication. Continues on beta blocker Nadolol. Followed by [facility] for surveillance      Relevant Medications   losartan (COZAAR) 25 MG tablet   nadolol (CORGARD) 20 MG tablet   RESOLVED: Hepatic cirrhosis due to chronic hepatitis C infection (HCC)   Hyperlipidemia   Controlled cholesterol on lifestyle Last lipid panel 11/2023 The 10-year ASCVD risk score (Arnett DK, et al., 2019) is: 11.7%   Plan: 1. Defer statin, declined and patient has cirrhosis 2. Encourage improved lifestyle - low carb/cholesterol, reduce portion size,  continue improving regular exercise      Relevant Medications   losartan (COZAAR) 25 MG tablet   nadolol (CORGARD) 20 MG tablet   Other Relevant Orders   CT CARDIAC SCORING (SELF PAY ONLY)   Portal hypertension (HCC)   BP improved Overall controlled, asymptomatic Otherwise stable, without complication Continues on beta blocker Nadolol Followed by [facility] for surveillance  Plan: Continue Losartan 25mg  daily, Nadaolol 20mg  daily      Relevant Medications   losartan (COZAAR) 25 MG tablet   nadolol (CORGARD) 20 MG tablet   Other Relevant Orders   CT CARDIAC SCORING (SELF PAY ONLY)   Prediabetes - Primary   A1c 5.4 improved Emphasis diet changes. Continue to improve lifestyle maintain A1c No medication at this time Repeat 12 month      Relevant Orders   CT CARDIAC SCORING (SELF PAY ONLY)   Other Visit Diagnoses       Elevated TSH         Flu vaccine need       Relevant Orders   Flu Vaccine Trivalent High Dose (Fluad) (Completed)        Updated Health Maintenance information Reviewed recent lab results with patient Encouraged improvement to lifestyle with diet and exercise Goal of weight loss  Thyroid Function Subclinical Slightly low T4, indicating potential hypothyroidism. No symptoms of hypothyroidism reported. -Monitor thyroid function, no treatment required at this time.  Lower Back Pain Reports of lower back pain upon waking, likely due to arthritis. -Consider X-ray if pain persists for 4 weeks.   General Health Maintenance / Followup Plans -Administer Prevnar 20 pneumonia vaccine at next convenient opportunity. -Order CT scan for heart to check for blockages in arteries. -Schedule annual check-up for next year.         Orders Placed This Encounter  Procedures   CT CARDIAC SCORING (SELF PAY ONLY)  Standing Status:   Future    Expiration Date:   12/11/2024    Preferred imaging location?:   [facility]   Flu Vaccine Trivalent High Dose  (Fluad)    Meds ordered this encounter  Medications   losartan (COZAAR) 25 MG tablet    Sig: Take 1 tablet (25 mg total) by mouth daily.    Dispense:  90 tablet    Refill:  3    Add refills. DX Code Needed  K76.6   nadolol (CORGARD) 20 MG tablet    Sig: Take 0.5 tablets (10 mg total) by mouth every evening.    Dispense:  45 tablet    Refill:  3    Add refills DX Code Needed  K76.6     Follow up plan: Return for 1 year fasting lab > 1 week later Annual Physical.  Saralyn Pilar, DO [facility] 12/12/2023, 8:48 AM

## 2024-06-06 ENCOUNTER — Ambulatory Visit: Payer: Self-pay | Admitting: Dermatology

## 2024-07-17 ENCOUNTER — Ambulatory Visit: Payer: Self-pay | Admitting: Dermatology

## 2024-07-23 ENCOUNTER — Encounter: Payer: Self-pay | Admitting: Dermatology

## 2024-07-23 ENCOUNTER — Ambulatory Visit (INDEPENDENT_AMBULATORY_CARE_PROVIDER_SITE_OTHER): Admitting: Dermatology

## 2024-07-23 DIAGNOSIS — D485 Neoplasm of uncertain behavior of skin: Secondary | ICD-10-CM

## 2024-07-23 DIAGNOSIS — C4491 Basal cell carcinoma of skin, unspecified: Secondary | ICD-10-CM

## 2024-07-23 DIAGNOSIS — D492 Neoplasm of unspecified behavior of bone, soft tissue, and skin: Secondary | ICD-10-CM

## 2024-07-23 DIAGNOSIS — C44219 Basal cell carcinoma of skin of left ear and external auricular canal: Secondary | ICD-10-CM

## 2024-07-23 HISTORY — DX: Basal cell carcinoma of skin, unspecified: C44.91

## 2024-07-23 NOTE — Progress Notes (Signed)
   New Patient Visit   Subjective  Jesus Mejia is a 68 y.o. male who presents for the following: Irregular skin lesion on the L ear x 2 years, itches, will not completely heal, but it has improved over the past few weeks. Pt does scratch at lesion.  The following portions of the chart were reviewed this encounter and updated as appropriate: medications, allergies, medical history  Review of Systems:  No other skin or systemic complaints except as noted in HPI or Assessment and Plan.  Objective  Well appearing patient in no apparent distress; mood and affect are within normal limits.   A focused examination was performed of the following areas: the face and scalp   Relevant exam findings are noted in the Assessment and Plan.  Left Ear 4 mm pink ulcerated papule   Assessment & Plan   NEOPLASM OF UNCERTAIN BEHAVIOR OF SKIN Left Ear Skin / nail biopsy Type of biopsy: tangential   Informed consent: discussed and consent obtained   Timeout: patient name, date of birth, surgical site, and procedure verified   Procedure prep:  Patient was prepped and draped in usual sterile fashion Prep type:  Isopropyl alcohol Anesthesia: the lesion was anesthetized in a standard fashion   Anesthetic:  1% lidocaine  w/ epinephrine 1-100,000 buffered w/ 8.4% NaHCO3 Instrument used: flexible razor blade   Hemostasis achieved with: pressure, aluminum chloride and electrodesiccation   Outcome: patient tolerated procedure well   Post-procedure details: sterile dressing applied and wound care instructions given   Dressing type: bandage (Mupirocin 2% ointment)    Specimen 1 - Surgical pathology Differential Diagnosis: SCC vs chondrodermatitis nodularis helicis  Check Margins: No  Return if symptoms worsen or fail to improve.  Jesus Mejia, CMA, am acting as scribe for Boneta Sharps, MD .   Documentation: I have reviewed the above documentation for accuracy and completeness, and I  agree with the above.  Boneta Sharps, MD

## 2024-07-23 NOTE — Patient Instructions (Signed)

## 2024-07-25 LAB — SURGICAL PATHOLOGY

## 2024-07-28 ENCOUNTER — Ambulatory Visit: Payer: Self-pay | Admitting: Dermatology

## 2024-07-28 DIAGNOSIS — C44219 Basal cell carcinoma of skin of left ear and external auricular canal: Secondary | ICD-10-CM

## 2024-07-29 NOTE — Telephone Encounter (Signed)
-----   Message from [place] sent at 07/28/2024  7:11 PM EDT ----- Diagnosis: left ear :       BASAL CELL CARCINOMA, NODULAR PATTERN   Please call with diagnosis and determine where the patient would like to have Mohs surgery.  Explanation: your biopsy shows a basal cell skin cancer in the second layer of the skin. This is the most common kind of skin cancer and is caused by damage from sun exposure. Basal cell skin cancers  almost never spread beyond the skin, so they are not dangerous to your overall health. However, they will continue to grow, can bleed, cause nonhealing wounds, and disrupt nearby structures unless  fully treated.  Treatment: Given the location and type of skin cancer, I recommend Mohs surgery. Mohs surgery involves cutting out the skin cancer and then checking under the microscope to ensure the whole skin  cancer was removed. If any skin cancer remains, the surgeon will cut out more until it is fully removed. The cure rate is about 98-99%. Once the Mohs surgeon confirms the skin cancer is out, they  will discuss the options to repair or heal the area. You must take it easy for about two weeks after surgery (no lifting over 10-15 lbs, avoid activity to get your heart rate and blood pressure up).  It is done at another office outside of [place] ([place], [place], or [place]). ----- Message ----- From: Interface, Lab In Three Zero One Sent: 07/25/2024   5:34 PM EDT To: Boneta Sharps, MD

## 2024-07-30 NOTE — Telephone Encounter (Signed)
 Patient advised of BX results and referred to Dr. Paci for [facility]. aw

## 2024-07-30 NOTE — Addendum Note (Signed)
 Addended by: TERESA PALMA R on: 07/30/2024 02:49 PM   Modules accepted: Orders

## 2024-08-27 ENCOUNTER — Encounter: Payer: Self-pay | Admitting: Dermatology

## 2024-09-02 ENCOUNTER — Ambulatory Visit (INDEPENDENT_AMBULATORY_CARE_PROVIDER_SITE_OTHER): Admitting: Dermatology

## 2024-09-02 ENCOUNTER — Encounter: Payer: Self-pay | Admitting: Dermatology

## 2024-09-02 VITALS — BP 135/83 | HR 60 | Temp 97.9°F

## 2024-09-02 DIAGNOSIS — C44219 Basal cell carcinoma of skin of left ear and external auricular canal: Secondary | ICD-10-CM | POA: Diagnosis not present

## 2024-09-02 DIAGNOSIS — L579 Skin changes due to chronic exposure to nonionizing radiation, unspecified: Secondary | ICD-10-CM | POA: Diagnosis not present

## 2024-09-02 DIAGNOSIS — L814 Other melanin hyperpigmentation: Secondary | ICD-10-CM | POA: Diagnosis not present

## 2024-09-02 DIAGNOSIS — C4491 Basal cell carcinoma of skin, unspecified: Secondary | ICD-10-CM

## 2024-09-02 MED ORDER — MUPIROCIN 2 % EX OINT: 1.0000 | TOPICAL_OINTMENT | Freq: Two times a day (BID) | CUTANEOUS | 2 refills | Status: AC

## 2024-09-02 NOTE — Progress Notes (Signed)
 Follow-Up Visit   Subjective  Jesus Mejia is a 68 y.o. male who presents for the following: Mohs of Nodular Basal Cell Carcinoma of the left ear, referred by Dr. Claudene.   The following portions of the chart were reviewed this encounter and updated as appropriate: medications, allergies, medical history  Review of Systems:  No other skin or systemic complaints except as noted in HPI or Assessment and Plan.  Objective  Well appearing patient in no apparent distress; mood and affect are within normal limits.  A focused examination was performed of the following areas: Left ear Relevant physical exam findings are noted in the Assessment and Plan.   Left Ear Healing biopsy site   Assessment & Plan   BASAL CELL CARCINOMA (BCC), UNSPECIFIED SITE Left Ear Mohs surgery  Consent obtained: written  Anticoagulation: Was the anticoagulation regimen changed prior to Mohs? No    Anesthesia: Anesthesia method: local infiltration Local anesthetic: lidocaine  1% WITH epi  Procedure Details: Timeout: pre-procedure verification complete Procedure Prep: patient was prepped and draped in usual sterile fashion Prep type: chlorhexidine Biopsy accession number: [id number] Pre-Op diagnosis: basal cell carcinoma BCC subtype: nodular MohsAIQ Surgical site (if tumor spans multiple areas, please select predominant area): ear Surgery side: left Surgical site (from skin exam): Left Ear Pre-operative length (cm): 0.5 Pre-operative width (cm): 0.5 Indications for Mohs surgery: anatomic location where tissue conservation is critical  Micrographic Surgery Details: Post-operative length (cm): 0.8 Post-operative width (cm): 0.7 Number of Mohs stages: 1 Cumulative additional sections past 5 per stage: 0  Stage 1    Tumor features identified on Mohs section: no tumor identified  Reconstruction: Was the defect reconstructed?: No    Opioids: Did the patient receive a prescription  for opioid/narcotic related to Mohs surgery?: No      Return in about 4 weeks (around 09/30/2024) for wound check.  LILLETTE Jesus Mejia, CMA, am acting as scribe for Jesus CHRISTELLA HOLY, MD.    09/02/2024  HISTORY OF PRESENT ILLNESS  Jesus Mejia is seen in consultation at the request of Dr. Claudene for biopsy-proven Nodular Basal Cell Carcinoma of the left helix. They note that the area has been present for about 6 months increasing in size with time.  There is no history of previous treatment.  Reports no other new or changing lesions and has no other complaints today.  Medications and allergies: see patient chart.  Review of systems: Reviewed 8 systems and notable for the above skin cancer.  All other systems reviewed are unremarkable/negative, unless noted in the HPI. Past medical history, surgical history, family history, social history were also reviewed and are noted in the chart/questionnaire.    PHYSICAL EXAMINATION  General: Well-appearing, in no acute distress, alert and oriented x 4. Vitals reviewed in chart (if available).   Skin: Exam reveals a 0.5 x 0.5 cm erythematous papule and biopsy scar on the left helix. There are rhytids, telangiectasias, and lentigines, consistent with photodamage.  Biopsy report(s) reviewed, confirming the diagnosis.   ASSESSMENT  1) Nodular Basal Cell Carcinoma of the left helix 2) photodamage 3) solar lentigines   PLAN   1. Due to location, size, histology, or recurrence and the likelihood of subclinical extension as well as the need to conserve normal surrounding tissue, the patient was deemed acceptable for Mohs micrographic surgery (MMS).  The nature and purpose of the procedure, associated benefits and risks including recurrence and scarring, possible complications such as pain, infection, and bleeding, and alternative  methods of treatment if appropriate were discussed with the patient during consent. The lesion location was verified by the  patient, by reviewing previous notes, pathology reports, and by photographs as well as angulation measurements if available.  Informed consent was reviewed and signed by the patient, and timeout was performed at 9:00 AM. See op note below.  2. For the photodamage and solar lentigines, sun protection discussed/information given on OTC sunscreens, and we recommend continued regular follow-up with primary dermatologist every 6 months or sooner for any growing, bleeding, or changing lesions. 3. Prognosis and future surveillance discussed. 4. Letter with treatment outcome sent to referring provider. 5. Pain acetaminophen/ibuprofen  MOHS MICROGRAPHIC SURGERY AND RECONSTRUCTION  Initial size:   0.5 x 0.5 cm Surgical defect/wound size: 0.8 x 0.7 cm Anesthesia:    0.33% lidocaine  with 1:200,000 epinephrine EBL:    <5 mL Complications:  None Repair type:   Second Intention  Stages: 1  STAGE I: Anesthesia achieved with 0.5% lidocaine  with 1:200,000 epinephrine. ChloraPrep applied. 1 section(s) excised using Mohs technique (this includes total peripheral and deep tissue margin excision and evaluation with frozen sections, excised and interpreted by the same physician). The tumor was first debulked and then excised with an approx. 2mm margin.  Hemostasis was achieved with electrocautery as needed.  The specimen was then oriented, subdivided/relaxed, inked, and processed using Mohs technique.    Frozen section analysis revealed a clear deep and peripheral margin.  Reconstruction  Patient was notified of results and repair options were discussed, including second intention healing. After reviewing the advantages and disadvantages of each, we agreed on second intention healing as appropriate.   The surgical site was then lightly scrubbed with sterile, saline-soaked gauze.  The area was bandaged using Vaseline ointment, non-adherent gauze, gauze pads, and tape to provide an adequate pressure dressing.   The  patient tolerated the procedure well, was given detailed written and verbal wound care instructions, and was discharged in good condition.  The patient will follow-up in 4 weeks and as scheduled with primary dermatologist.    Documentation: I have reviewed the above documentation for accuracy and completeness, and I agree with the above.  Jesus CHRISTELLA HOLY, MD

## 2024-09-02 NOTE — Patient Instructions (Signed)

## 2024-09-19 ENCOUNTER — Encounter: Payer: Self-pay | Admitting: Dermatology

## 2024-09-30 ENCOUNTER — Ambulatory Visit: Admitting: Dermatology

## 2024-09-30 ENCOUNTER — Encounter: Payer: Self-pay | Admitting: Dermatology

## 2024-09-30 DIAGNOSIS — L905 Scar conditions and fibrosis of skin: Secondary | ICD-10-CM

## 2024-09-30 DIAGNOSIS — Z85828 Personal history of other malignant neoplasm of skin: Secondary | ICD-10-CM

## 2024-09-30 DIAGNOSIS — C4491 Basal cell carcinoma of skin, unspecified: Secondary | ICD-10-CM

## 2024-09-30 DIAGNOSIS — W908XXA Exposure to other nonionizing radiation, initial encounter: Secondary | ICD-10-CM | POA: Diagnosis not present

## 2024-09-30 DIAGNOSIS — L570 Actinic keratosis: Secondary | ICD-10-CM | POA: Diagnosis not present

## 2024-09-30 DIAGNOSIS — Z48817 Encounter for surgical aftercare following surgery on the skin and subcutaneous tissue: Secondary | ICD-10-CM | POA: Diagnosis not present

## 2024-09-30 NOTE — Progress Notes (Signed)
   Follow Up Visit   Subjective  Jesus Mejia is a 68 y.o. male who presents for the following: follow up from Mohs surgery   The patient presents for follow up from Mohs surgery for a BCC on the left ear, treated on 09/02/24, healing by 2nd intention. The patient has been bandaging the wound as directed. The endorse the following concerns: none  The following portions of the chart were reviewed this encounter and updated as appropriate: medications, allergies, medical history  Review of Systems:  No other skin or systemic complaints except as noted in HPI or Assessment and Plan.  Objective  Well appearing patient in no apparent distress; mood and affect are within normal limits.  A focal examination was performed including scalp, head, face and left ear. All findings within normal limits unless otherwise noted below.  Healing wound with mild erythema  Relevant physical exam findings are noted in the Assessment and Plan.  Right Forearm - Posterior Erythematous thin papules/macules with gritty scale.   Assessment & Plan   Healing Wound s/p Mohs for [facility] on the left helix, treated on 09/02/24, healing by 2nd intention - Reassured that wound is healing well - No evidence of infection - No swelling, induration, purulence, dehiscence, or tenderness out of proportion to the clinical exam, see photo above - Discussed that scars take up to 12 months to mature from the date of surgery - Recommend SPF 30+ to scar daily to prevent purple color from UV exposure during scar maturation process - Discussed that erythema and raised appearance of scar will fade over the next 4-6 months - OK to start scar massage at 4-6 weeks post-op - Can consider silicone based products for scar healing starting at 6 weeks post-op - Ok to continue ointment daily to wound under a bandage for another week  HISTORY OF BASAL CELL CARCINOMA OF THE SKIN - No evidence of recurrence today - Recommend regular full  body skin exams - Recommend daily broad spectrum sunscreen SPF 30+ to sun-exposed areas, reapply every 2 hours as needed.  - Call if any new or changing lesions are noted between office visits  AK (ACTINIC KERATOSIS) Right Forearm - Posterior Destruction of lesion - Right Forearm - Posterior Complexity: simple   Destruction method: cryotherapy   Informed consent: discussed and consent obtained   Timeout:  patient name, date of birth, surgical site, and procedure verified Outcome: patient tolerated procedure well with no complications   Post-procedure details: wound care instructions given    BASAL CELL CARCINOMA (BCC), UNSPECIFIED SITE   SCAR    Return for follow up with primary Dermatologist.  I, Darice Smock, CMA, am acting as scribe for RUFUS CHRISTELLA HOLY, MD.   Documentation: I have reviewed the above documentation for accuracy and completeness, and I agree with the above.  RUFUS CHRISTELLA HOLY, MD

## 2024-12-09 ENCOUNTER — Other Ambulatory Visit: Payer: Self-pay

## 2024-12-09 ENCOUNTER — Other Ambulatory Visit: Payer: Self-pay | Admitting: Family Medicine

## 2024-12-09 DIAGNOSIS — R7303 Prediabetes: Secondary | ICD-10-CM

## 2024-12-09 DIAGNOSIS — N400 Enlarged prostate without lower urinary tract symptoms: Secondary | ICD-10-CM

## 2024-12-09 DIAGNOSIS — B1920 Unspecified viral hepatitis C without hepatic coma: Secondary | ICD-10-CM

## 2024-12-09 DIAGNOSIS — E782 Mixed hyperlipidemia: Secondary | ICD-10-CM

## 2024-12-09 DIAGNOSIS — R7989 Other specified abnormal findings of blood chemistry: Secondary | ICD-10-CM

## 2024-12-09 DIAGNOSIS — K766 Portal hypertension: Secondary | ICD-10-CM

## 2024-12-09 DIAGNOSIS — K7460 Unspecified cirrhosis of liver: Secondary | ICD-10-CM

## 2024-12-10 LAB — CBC WITH DIFFERENTIAL/PLATELET
Absolute Lymphocytes: 1523 {cells}/uL (ref 850–3900)
Absolute Monocytes: 400 {cells}/uL (ref 200–950)
Basophils Absolute: 28 {cells}/uL (ref 0–200)
Basophils Relative: 0.6 %
Eosinophils Absolute: 230 {cells}/uL (ref 15–500)
Eosinophils Relative: 4.9 %
HCT: 43.4 % (ref 39.4–51.1)
Hemoglobin: 14.9 g/dL (ref 13.2–17.1)
MCH: 31.6 pg (ref 27.0–33.0)
MCHC: 34.3 g/dL (ref 31.6–35.4)
MCV: 91.9 fL (ref 81.4–101.7)
MPV: 10.9 fL (ref 7.5–12.5)
Monocytes Relative: 8.5 %
Neutro Abs: 2519 {cells}/uL (ref 1500–7800)
Neutrophils Relative %: 53.6 %
Platelets: 124 Thousand/uL — ABNORMAL LOW (ref 140–400)
RBC: 4.72 Million/uL (ref 4.20–5.80)
RDW: 13.1 % (ref 11.0–15.0)
Total Lymphocyte: 32.4 %
WBC: 4.7 Thousand/uL (ref 3.8–10.8)

## 2024-12-10 LAB — COMPREHENSIVE METABOLIC PANEL WITH GFR
AG Ratio: 2.2 (calc) (ref 1.0–2.5)
ALT: 16 U/L (ref 9–46)
AST: 19 U/L (ref 10–35)
Albumin: 4.6 g/dL (ref 3.6–5.1)
Alkaline phosphatase (APISO): 72 U/L (ref 35–144)
BUN/Creatinine Ratio: 15 (calc) (ref 6–22)
BUN: 10 mg/dL (ref 7–25)
CO2: 30 mmol/L (ref 20–32)
Calcium: 9.8 mg/dL (ref 8.6–10.3)
Chloride: 103 mmol/L (ref 98–110)
Creat: 0.68 mg/dL — ABNORMAL LOW (ref 0.70–1.35)
Globulin: 2.1 g/dL (ref 1.9–3.7)
Glucose, Bld: 134 mg/dL — ABNORMAL HIGH (ref 65–99)
Potassium: 4.6 mmol/L (ref 3.5–5.3)
Sodium: 139 mmol/L (ref 135–146)
Total Bilirubin: 1.3 mg/dL — ABNORMAL HIGH (ref 0.2–1.2)
Total Protein: 6.7 g/dL (ref 6.1–8.1)
eGFR: 103 mL/min/1.73m2 (ref >=60)

## 2024-12-10 LAB — LIPID PANEL
Cholesterol: 159 mg/dL (ref <200)
HDL: 50 mg/dL (ref >=40)
LDL Cholesterol (Calc): 86 mg/dL
Non-HDL Cholesterol (Calc): 109 mg/dL (ref <130)
Total CHOL/HDL Ratio: 3.2 (calc) (ref <5.0)
Triglycerides: 131 mg/dL (ref <150)

## 2024-12-10 LAB — HEMOGLOBIN A1C
Hgb A1c MFr Bld: 5.4 % (ref <5.7)
Mean Plasma Glucose: 108 mg/dL
eAG (mmol/L): 6 mmol/L

## 2024-12-10 LAB — TSH: TSH: 4.65 m[IU]/L — ABNORMAL HIGH (ref 0.40–4.50)

## 2024-12-10 LAB — PSA: PSA: 0.33 ng/mL (ref <=4.00)

## 2024-12-10 LAB — T4, FREE: Free T4: 0.8 ng/dL (ref 0.8–1.8)

## 2024-12-16 ENCOUNTER — Encounter: Payer: Self-pay | Admitting: Family Medicine

## 2024-12-16 ENCOUNTER — Ambulatory Visit: Payer: Self-pay | Admitting: Family Medicine

## 2024-12-16 VITALS — BP 122/70 | HR 41 | Ht 71.0 in | Wt 176.0 lb

## 2024-12-16 DIAGNOSIS — K7469 Other cirrhosis of liver: Secondary | ICD-10-CM | POA: Diagnosis not present

## 2024-12-16 DIAGNOSIS — R7303 Prediabetes: Secondary | ICD-10-CM

## 2024-12-16 DIAGNOSIS — I8510 Secondary esophageal varices without bleeding: Secondary | ICD-10-CM

## 2024-12-16 DIAGNOSIS — Z1211 Encounter for screening for malignant neoplasm of colon: Secondary | ICD-10-CM | POA: Diagnosis not present

## 2024-12-16 DIAGNOSIS — R7989 Other specified abnormal findings of blood chemistry: Secondary | ICD-10-CM | POA: Diagnosis not present

## 2024-12-16 DIAGNOSIS — K7460 Unspecified cirrhosis of liver: Secondary | ICD-10-CM | POA: Diagnosis not present

## 2024-12-16 DIAGNOSIS — B182 Chronic viral hepatitis C: Secondary | ICD-10-CM | POA: Diagnosis not present

## 2024-12-16 DIAGNOSIS — E782 Mixed hyperlipidemia: Secondary | ICD-10-CM

## 2024-12-16 DIAGNOSIS — N400 Enlarged prostate without lower urinary tract symptoms: Secondary | ICD-10-CM

## 2024-12-16 DIAGNOSIS — K766 Portal hypertension: Secondary | ICD-10-CM | POA: Diagnosis not present

## 2024-12-16 DIAGNOSIS — B1920 Unspecified viral hepatitis C without hepatic coma: Secondary | ICD-10-CM | POA: Diagnosis not present

## 2024-12-16 DIAGNOSIS — Z23 Encounter for immunization: Secondary | ICD-10-CM

## 2024-12-16 MED ORDER — LOSARTAN POTASSIUM 25 MG PO TABS: 25.0000 mg | ORAL_TABLET | Freq: Every day | ORAL | 3 refills | Status: AC

## 2024-12-16 MED ORDER — NADOLOL 20 MG PO TABS: 20.0000 mg | ORAL_TABLET | Freq: Every evening | ORAL | 3 refills | Status: AC

## 2024-12-16 NOTE — Progress Notes (Signed)
 "  Subjective:    Patient ID: Jesus Mejia, male    DOB: 1956/11/18, 68 y.o.   MRN: [id number]  Jesus Mejia is a 68 y.o. male presenting on 12/16/2024 for Annual Exam   HPI  Discussed the use of AI scribe software for clinical note transcription with the patient, who gave verbal consent to proceed.  History of Present Illness   Jesus Mejia is a 68 year old male who presents for an annual physical exam.  Dermatologic concerns - Recent procedures performed by dermatologist for basal cell carcinoma on ears - Affected area feels slightly strange but is healing well - Did not extensively use prescribed topical cream  Laboratory findings - Hemoglobin A1c is 5.4 - LDL cholesterol is 86 - PSA is 0.33 - Blood counts consistent with previous results, showing slightly low platelets due to liver issues but no anemia - Kidney function and liver enzymes are normal       Portal Hypertension PMH Hepatic Cirrhosis due to Chronic Hep C, Esophageal Varices, Portal HTN Thrombocytopenia Followed by [facility] History of varices and tachycardia   Elevated BP Current Meds - Losartan  25mg  daily, Nadolol  20mg  (half tablet evening) needs refills and asks for dose adjust up to whole tab 20mg  to avoid grogginess Reports good compliance, took meds today. Tolerating well, w/o complaints   Hepatic Cirrhosis due to Chronic Hep C (treated s/p Harvoni) / Complicated by Esophageal Varices, Portal HTN - Background history with prior history of chronic Hep C infection diagnosed in 2012 with complicated cirrhosis, he was treated with Harvoni by 2015 with complete resolution of HCV infection by report and chart review - Followed by [facility]Hepat[facility] Dr Isabell.   Pre-Diabetes Prior history years ago with PreDM Today result A1c 5.4, prior 5.8 to 6.0 CBGs: Not checking Meds: None Lifestyle: - Diet (goal to resume and improve low carb diet) - Exercise (Goal to resume outside  activity, beekeeper)   TSH Elevated Mild low Free T4 0.8 but normal TSH Not requiring treatment   Low Back Pain, positional The patient also reports experiencing lower back pain upon waking, particularly when he has been sleeping on his side. The pain subsides after a few minutes of lying on his back. This issue has been ongoing for a couple of months. The patient does not report any other new symptoms or health concerns.         PMH - Deafness Right Ear     Health Maintenance:   Flu Shot today   Future Prevnar-20 next pneumonia vaccine, 5 years    Screening PSA 0.33 (negative, 11/2024)   Colorectal cancer screening - Last colonoscopy performed in 2016 - Plans to schedule next colonoscopy for March of the following year     12/16/2024    8:57 AM 12/12/2023    8:33 AM 06/08/2023    8:58 AM  Depression screen PHQ 2/9  Decreased Interest 0 0 0  Down, Depressed, Hopeless 0 0 0  PHQ - 2 Score 0 0 0  Altered sleeping 0  1  Tired, decreased energy 1  0  Change in appetite 1  0  Feeling bad or failure about yourself  0  0  Trouble concentrating 0  0  Moving slowly or fidgety/restless 0  0  Suicidal thoughts 0  0  PHQ-9 Score 2  1   Difficult doing work/chores Not difficult at all  Not difficult at all     Data saved with a  previous flowsheet row definition       12/16/2024    8:57 AM 12/12/2023    8:33 AM 06/08/2023    8:58 AM 06/07/2022    8:21 AM  GAD 7 : Generalized Anxiety Score  Nervous, Anxious, on Edge 0 0 0 1  Control/stop worrying 0 0 0 1  Worry too much - different things 0 0 0 1  Trouble relaxing 0 0 0 1  Restless 0 0 0 0  Easily annoyed or irritable 0 0 0 1  Afraid - awful might happen 0 0 0 0  Total GAD 7 Score 0 0 0 5  Anxiety Difficulty Not difficult at all  Not difficult at all Not difficult at all     Past Medical History:  Diagnosis Date   Acute hepatitis C    Basal cell carcinoma 07/23/2024   Left ear. Nodular. Mohs treatment  recommended, pending patient response.   Bleeding nose    Chronic fatigue    Cirrhosis, non-alcoholic (HCC)    Elevated serum glucose    Past Surgical History:  Procedure Laterality Date   HERNIA REPAIR     Social History   Socioeconomic History   Marital status: Married    Spouse name: Not on file   Number of children: Not on file   Years of education: Not on file   Highest education level: Not on file  Occupational History   Occupation: retired  Tobacco Use   Smoking status: Former    Current packs/day: 0.00    Average packs/day: 1 pack/day for 20.0 years (20.0 ttl pk-yrs)    Types: Cigarettes    Start date: 09/08/1973    Quit date: 09/08/1993    Years since quitting: 31.2   Smokeless tobacco: Former  Building Services Engineer status: Never Used  Substance and Sexual Activity   Alcohol use: No    Alcohol/week: 0.0 standard drinks of alcohol    Comment: former quit 14 years ago   Drug use: No    Comment: former quit 15 years ago   Sexual activity: Not on file  Other Topics Concern   Not on file  Social History Narrative   Not on file   Social Drivers of Health   Tobacco Use: Medium Risk (12/16/2024)   Patient History    Smoking Tobacco Use: Former    Smokeless Tobacco Use: Former    Passive Exposure: Not on Stage Manager: 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 Insecurity: Not on file  Transportation Needs: Not on file  Physical Activity: Not on file  Stress: Not on file  Social Connections: Not on file  Intimate Partner Violence: Not on file  Depression (PHQ2-9): Low Risk (12/16/2024)   Depression (PHQ2-9)    PHQ-2 Score: 2  Alcohol Screen: Not on file  Housing: Not on file  Utilities: Not on file  Health Literacy: Not on file   Family History  Problem Relation Age of Onset   Cancer Mother        breast   Cancer Sister        breast   Hypertension Brother    Prostate cancer Neg Hx    Current Outpatient Medications on File Prior to Visit  Medication Sig    mupirocin  ointment (BACTROBAN ) 2 % Apply 1 Application topically 2 (two) times daily.   No current facility-administered medications on file prior to visit.    Review of Systems  Constitutional:  Negative for activity change, appetite  change, chills, diaphoresis, fatigue and fever.  HENT:  Negative for congestion and hearing loss.   Eyes:  Negative for visual disturbance.  Respiratory:  Negative for cough, chest tightness, shortness of breath and wheezing.   Cardiovascular:  Negative for chest pain, palpitations and leg swelling.  Gastrointestinal:  Negative for abdominal pain, constipation, diarrhea, nausea and vomiting.  Genitourinary:  Negative for dysuria, frequency and hematuria.  Musculoskeletal:  Negative for arthralgias and neck pain.  Skin:  Negative for rash.  Neurological:  Negative for dizziness, weakness, light-headedness, numbness and headaches.  Hematological:  Negative for adenopathy.  Psychiatric/Behavioral:  Negative for behavioral problems, dysphoric mood and sleep disturbance.    Per HPI unless specifically indicated above     Objective:    BP 122/70 (BP Location: Right Arm, Patient Position: Sitting, Cuff Size: Normal)   Pulse (!) 41   Ht 5' 11 (1.803 m)   Wt 176 lb (79.8 kg)   SpO2 98%   BMI 24.55 kg/m   Wt Readings from Last 3 Encounters:  12/16/24 176 lb (79.8 kg)  12/12/23 169 lb (76.7 kg)  06/08/23 185 lb (83.9 kg)    Physical Exam Vitals and nursing note reviewed.  Constitutional:      General: He is not in acute distress.    Appearance: He is well-developed. He is not diaphoretic.     Comments: Well-appearing, comfortable, cooperative  HENT:     Head: Normocephalic and atraumatic.  Eyes:     General:        Right eye: No discharge.        Left eye: No discharge.     Conjunctiva/sclera: Conjunctivae normal.     Pupils: Pupils are equal, round, and reactive to light.  Neck:     Thyroid : No thyromegaly.     Vascular: No carotid bruit.   Cardiovascular:     Rate and Rhythm: Normal rate and regular rhythm.     Pulses: Normal pulses.     Heart sounds: Normal heart sounds. No murmur heard. Pulmonary:     Effort: Pulmonary effort is normal. No respiratory distress.     Breath sounds: Normal breath sounds. No wheezing or rales.  Abdominal:     General: Bowel sounds are normal. There is no distension.     Palpations: Abdomen is soft. There is no mass.     Tenderness: There is no abdominal tenderness.  Musculoskeletal:        General: No tenderness. Normal range of motion.     Cervical back: Normal range of motion and neck supple.     Right lower leg: No edema.     Left lower leg: No edema.     Comments: Upper / Lower Extremities: - Normal muscle tone, strength bilateral upper extremities 5/5, lower extremities 5/5  Lymphadenopathy:     Cervical: No cervical adenopathy.  Skin:    General: Skin is warm and dry.     Findings: No erythema or rash.  Neurological:     Mental Status: He is alert and oriented to person, place, and time.     Comments: Distal sensation intact to light touch all extremities  Psychiatric:        Mood and Affect: Mood normal.        Behavior: Behavior normal.        Thought Content: Thought content normal.     Comments: Well groomed, good eye contact, normal speech and thoughts     Results for orders placed or  performed in visit on 12/09/24  Lipid panel   Collection Time: 12/09/24  8:31 AM  Result Value Ref Range   Cholesterol 159 <200 mg/dL   HDL 50 > OR = 40 mg/dL   Triglycerides 868 <849 mg/dL   LDL Cholesterol (Calc) 86 mg/dL (calc)   Total CHOL/HDL Ratio 3.2 <5.0 (calc)   Non-HDL Cholesterol (Calc) 109 <130 mg/dL (calc)  Hemoglobin J8r   Collection Time: 12/09/24  8:31 AM  Result Value Ref Range   Hgb A1c MFr Bld 5.4 <5.7 %   Mean Plasma Glucose 108 mg/dL   eAG (mmol/L) 6.0 mmol/L  CBC with Differential/Platelet   Collection Time: 12/09/24  8:31 AM  Result Value Ref Range    WBC 4.7 3.8 - 10.8 Thousand/uL   RBC 4.72 4.20 - 5.80 Million/uL   Hemoglobin 14.9 13.2 - 17.1 g/dL   HCT 56.5 60.5 - 48.8 %   MCV 91.9 81.4 - 101.7 fL   MCH 31.6 27.0 - 33.0 pg   MCHC 34.3 31.6 - 35.4 g/dL   RDW 86.8 88.9 - 84.9 %   Platelets 124 (L) 140 - 400 Thousand/uL   MPV 10.9 7.5 - 12.5 fL   Neutro Abs 2,519 1,500 - 7,800 cells/uL   Absolute Lymphocytes 1,523 850 - 3,900 cells/uL   Absolute Monocytes 400 200 - 950 cells/uL   Eosinophils Absolute 230 15 - 500 cells/uL   Basophils Absolute 28 0 - 200 cells/uL   Neutrophils Relative % 53.6 %   Total Lymphocyte 32.4 %   Monocytes Relative 8.5 %   Eosinophils Relative 4.9 %   Basophils Relative 0.6 %   Smear Review    PSA   Collection Time: 12/09/24  8:31 AM  Result Value Ref Range   PSA 0.33 < OR = 4.00 ng/mL  TSH   Collection Time: 12/09/24  8:31 AM  Result Value Ref Range   TSH 4.65 (H) 0.40 - 4.50 mIU/L  Comprehensive metabolic panel with GFR   Collection Time: 12/09/24  8:31 AM  Result Value Ref Range   Glucose, Bld 134 (H) 65 - 99 mg/dL   BUN 10 7 - 25 mg/dL   Creat 9.31 (L) 9.29 - 1.35 mg/dL   eGFR 896 > OR = 60 fO/fpw/8.26f7   BUN/Creatinine Ratio 15 6 - 22 (calc)   Sodium 139 135 - 146 mmol/L   Potassium 4.6 3.5 - 5.3 mmol/L   Chloride 103 98 - 110 mmol/L   CO2 30 20 - 32 mmol/L   Calcium 9.8 8.6 - 10.3 mg/dL   Total Protein 6.7 6.1 - 8.1 g/dL   Albumin 4.6 3.6 - 5.1 g/dL   Globulin 2.1 1.9 - 3.7 g/dL (calc)   AG Ratio 2.2 1.0 - 2.5 (calc)   Total Bilirubin 1.3 (H) 0.2 - 1.2 mg/dL   Alkaline phosphatase (APISO) 72 35 - 144 U/L   AST 19 10 - 35 U/L   ALT 16 9 - 46 U/L  T4, free   Collection Time: 12/09/24  8:31 AM  Result Value Ref Range   Free T4 0.8 0.8 - 1.8 ng/dL      Assessment & Plan:   Problem List Items Addressed This Visit     Compensated cirrhosis related to hepatitis C virus (HCV) (HCC)   Esophageal varices in cirrhosis (HCC)   Relevant Medications   nadolol  (CORGARD ) 20 MG tablet    losartan  (COZAAR ) 25 MG tablet   Hyperlipidemia   Relevant Medications   nadolol  (CORGARD )  20 MG tablet   losartan  (COZAAR ) 25 MG tablet   Other Relevant Orders   CT CARDIAC SCORING (SELF PAY ONLY)   Portal hypertension (HCC)   Relevant Medications   nadolol  (CORGARD ) 20 MG tablet   losartan  (COZAAR ) 25 MG tablet   Prediabetes   Other Visit Diagnoses       Hepatic cirrhosis due to chronic hepatitis C infection (HCC)    -  Primary     Flu vaccine need       Relevant Orders   Flu vaccine HIGH DOSE PF(Fluzone Trivalent) (Completed)     Elevated TSH         Benign prostatic hyperplasia without lower urinary tract symptoms         Screening for colon cancer       Relevant Orders   Ambulatory referral to Gastroenterology        Updated Health Maintenance information Reviewed recent lab results with patient Encouraged improvement to lifestyle with diet and exercise Goal of weight loss  Annual wellness visit conducted. Labs reviewed: TSH slightly elevated, A1c at 5.4, LDL at 86, PSA at 0.33, normal liver enzymes, blood pressure at 122/70, no anemia or fluid retention. - Administered flu shot today. - Recommended Prevnar 20 pneumonia vaccine at a later date. - Discussed shingles vaccine and its potential side effects. - Ordered heart scan to assess for arterial issues. - Scheduled colonoscopy for next year. - Increased nadolol  dosage from half to a whole tablet. - Refilled losartan  and nadolol  prescriptions.  Compensated cirrhosis due to chronic hepatitis C Portal Hypertension Esophageal Varices Liver enzymes are normal and there are no signs of decompensation. Followed by [facility] On Nadolol  for varices, needs dose adjusted prefers whole tab 20mg  instead of half = 10mg   Benign prostatic hyperplasia PSA level is 0.33, slightly increased from previous levels but not concerning for prostate cancer.  Elevated TSH TSH is slightly elevated but T4 is  normal. No treatment required as levels are not consistently elevated.  Mixed hyperlipidemia LDL is at 86, indicating good control. No cholesterol medication required at this time. - Ordered heart scan to assess for arterial issues.          Orders Placed This Encounter  Procedures   CT CARDIAC SCORING (SELF PAY ONLY)    Standing Status:   Future    Expiration Date:   12/16/2025    Preferred imaging location?:   [facility]   Flu vaccine HIGH DOSE PF(Fluzone Trivalent)   Ambulatory referral to Gastroenterology    Referral Priority:   Routine    Referral Type:   Consultation    Referral Reason:   Specialty Services Required    Number of Visits Requested:   1    Meds ordered this encounter  Medications   nadolol  (CORGARD ) 20 MG tablet    Sig: Take 1 tablet (20 mg total) by mouth every evening.    Dispense:  90 tablet    Refill:  3    Dose increase from half to whole tab. Add refills DX Code Needed  K76.6   losartan  (COZAAR ) 25 MG tablet    Sig: Take 1 tablet (25 mg total) by mouth daily.    Dispense:  90 tablet    Refill:  3    Add refills. DX Code Needed  K76.6     Follow up plan: Return for 1 year fasting lab > 1 week later Annual Physical.  Marsa Officer, DO [facility] 12/16/2024, 9:43 AM  "

## 2024-12-16 NOTE — Patient Instructions (Addendum)
 Thank you for coming to the office today.  Dose increase Nadolol  from half tab to whole tab  Flu Shot done today  I do recommend the Prevnar-20 Pneumonia vaccine - 1 dose lasts 5 years or more, here or at the pharmacy.   Referral to GI for Colonoscopy  [facility]) [address] - [address] [place], [place] [address] Phone: [phone]   You have been referred for a Coronary Calcium Score Cardiac CT Scan. This is a screening test for patients aged 64-50+ with cardiovascular risk factors or who are healthy but would be interested in Cardiovascular Screening for heart disease. Even if there is a family history of heart disease, this imaging can be useful. Typically it can be done every 5+ years or at a different timeline we agree on  The scan will look at the chest and mainly focus on the heart and identify early signs of calcium build up or blockages within the heart arteries. It is not 100% accurate for identifying blockages or heart disease, but it is useful to help us  predict who may have some early changes or be at risk in the future for a heart attack or cardiovascular problem.  The results are reviewed by a Cardiologist and they will document the results. It should become available on MyChart. Typically the results are divided into percentiles based on other patients of the same demographic and age. So it will compare your risk to others similar to you. If you have a higher score >99 or higher percentile >75%tile, it is recommended to consider Statin cholesterol therapy and or referral to Cardiologist. I will try to help explain your results and if we have questions we can contact the Cardiologist.  You will be contacted for scheduling. Usually it is done at any imaging facility through [facility], [facility] or [facility].  The cost is $99 flat fee total and it does not go through insurance, so no authorization is  required.   Please schedule a Follow-up Appointment to: Return for 1 year fasting lab > 1 week later Annual Physical.  If you have any other questions or concerns, please feel free to call the office or send a message through MyChart. You may also schedule an earlier appointment if necessary.  Additionally, you may be receiving a survey about your experience at our office within a few days to 1 week by e-mail or mail. We value your feedback.  Marsa Officer, DO [facility], [place]

## 2024-12-18 ENCOUNTER — Other Ambulatory Visit: Payer: Self-pay

## 2024-12-18 ENCOUNTER — Telehealth: Payer: Self-pay

## 2024-12-18 DIAGNOSIS — Z1211 Encounter for screening for malignant neoplasm of colon: Secondary | ICD-10-CM

## 2024-12-18 MED ORDER — PEG 3350-KCL-NA BICARB-NACL 420 G PO SOLR: 4000.0000 mL | Freq: Once | ORAL | 0 refills | Status: AC

## 2024-12-18 NOTE — Telephone Encounter (Signed)
 Gastroenterology Pre-Procedure Review  Request Date: 02/12/25 Requesting Physician: Dr. Melany  PATIENT REVIEW QUESTIONS: The patient responded to the following health history questions as indicated:    1. Are you having any GI issues? no 2. Do you have a personal history of Polyps? no 3. Do you have a family history of Colon Cancer or Polyps? no 4. Diabetes Mellitus? no 5. Joint replacements in the past 12 months?no 6. Major health problems in the past 3 months?no 7. Any artificial heart valves, MVP, or defibrillator?no    MEDICATIONS & ALLERGIES:    Patient reports the following regarding taking any anticoagulation/antiplatelet therapy:   Plavix, Coumadin, Eliquis, Xarelto, Lovenox, Pradaxa, Brilinta, or Effient? no Aspirin? no  Patient confirms/reports the following medications:  Current Outpatient Medications  Medication Sig Dispense Refill   mupirocin  ointment (BACTROBAN ) 2 % Apply 1 Application topically 2 (two) times daily. 22 g 2   losartan  (COZAAR ) 25 MG tablet Take 1 tablet (25 mg total) by mouth daily. 90 tablet 3   nadolol  (CORGARD ) 20 MG tablet Take 1 tablet (20 mg total) by mouth every evening. 90 tablet 3   No current facility-administered medications for this visit.    Patient confirms/reports the following allergies:  Allergies[1]  No orders of the defined types were placed in this encounter.   AUTHORIZATION INFORMATION Primary Insurance: 1D#: Group #:  Secondary Insurance: 1D#: Group #:  SCHEDULE INFORMATION: Date: 02/12/25 Time: Location: [facility]    [1] No Known Allergies

## 2025-02-12 ENCOUNTER — Ambulatory Visit: Admit: 2025-02-12 | Admitting: Gastroenterology

## 2025-12-02 ENCOUNTER — Other Ambulatory Visit

## 2025-12-09 ENCOUNTER — Encounter: Admitting: Family Medicine

## 2025-12-09 ENCOUNTER — Other Ambulatory Visit
